# Patient Record
Sex: Male | Born: 1970
Health system: Southern US, Community
[De-identification: ages and names within clinical notes are randomized; demographics above are authoritative.]

## PROBLEM LIST (undated history)

## (undated) DIAGNOSIS — I639 Cerebral infarction, unspecified: Secondary | ICD-10-CM

## (undated) DIAGNOSIS — E785 Hyperlipidemia, unspecified: Secondary | ICD-10-CM

## (undated) DIAGNOSIS — K08109 Complete loss of teeth, unspecified cause, unspecified class: Secondary | ICD-10-CM

## (undated) HISTORY — DX: Cerebral infarction, unspecified: I63.9

## (undated) HISTORY — DX: Hyperlipidemia, unspecified: E78.5

## (undated) HISTORY — PX: NO PAST SURGERIES: SHX2092

---

## 2007-03-31 ENCOUNTER — Emergency Department: Payer: Self-pay | Admitting: Emergency Medicine

## 2008-08-18 ENCOUNTER — Emergency Department: Payer: Self-pay | Admitting: Emergency Medicine

## 2015-08-14 DIAGNOSIS — I639 Cerebral infarction, unspecified: Secondary | ICD-10-CM

## 2015-08-14 HISTORY — DX: Cerebral infarction, unspecified: I63.9

## 2015-08-17 ENCOUNTER — Emergency Department
Admission: EM | Admit: 2015-08-17 | Discharge: 2015-08-17 | Disposition: A | Discharge status: Home / Self Care | Payer: BLUE CROSS/BLUE SHIELD | Attending: Student | Admitting: Student

## 2015-08-17 ENCOUNTER — Encounter: Payer: Self-pay | Admitting: Emergency Medicine

## 2015-08-17 DIAGNOSIS — R197 Diarrhea, unspecified: Secondary | ICD-10-CM | POA: Diagnosis not present

## 2015-08-17 DIAGNOSIS — R1032 Left lower quadrant pain: Secondary | ICD-10-CM | POA: Diagnosis present

## 2015-08-17 DIAGNOSIS — R1110 Vomiting, unspecified: Secondary | ICD-10-CM | POA: Diagnosis not present

## 2015-08-17 LAB — URINALYSIS COMPLETE WITH MICROSCOPIC (ARMC ONLY)
BACTERIA UA: NONE SEEN
BILIRUBIN URINE: NEGATIVE
GLUCOSE, UA: NEGATIVE mg/dL
KETONES UR: NEGATIVE mg/dL
LEUKOCYTES UA: NEGATIVE
NITRITE: NEGATIVE
PROTEIN: NEGATIVE mg/dL
SPECIFIC GRAVITY, URINE: 1.016 (ref 1.005–1.030)
Squamous Epithelial / LPF: NONE SEEN
pH: 5 (ref 5.0–8.0)

## 2015-08-17 LAB — CBC
HEMATOCRIT: 42.8 % (ref 40.0–52.0)
Hemoglobin: 14.5 g/dL (ref 13.0–18.0)
MCH: 29 pg (ref 26.0–34.0)
MCHC: 33.9 g/dL (ref 32.0–36.0)
MCV: 85.5 fL (ref 80.0–100.0)
Platelets: 292 10*3/uL (ref 150–440)
RBC: 5.01 MIL/uL (ref 4.40–5.90)
RDW: 13.4 % (ref 11.5–14.5)
WBC: 7.5 10*3/uL (ref 3.8–10.6)

## 2015-08-17 LAB — COMPREHENSIVE METABOLIC PANEL
ALK PHOS: 63 U/L (ref 38–126)
ALT: 45 U/L (ref 17–63)
ANION GAP: 7 (ref 5–15)
AST: 29 U/L (ref 15–41)
Albumin: 4.9 g/dL (ref 3.5–5.0)
BILIRUBIN TOTAL: 0.8 mg/dL (ref 0.3–1.2)
BUN: 12 mg/dL (ref 6–20)
CALCIUM: 9.9 mg/dL (ref 8.9–10.3)
CO2: 28 mmol/L (ref 22–32)
Chloride: 106 mmol/L (ref 101–111)
Creatinine, Ser: 0.92 mg/dL (ref 0.61–1.24)
GFR calc Af Amer: >60 mL/min (ref >60)
GLUCOSE: 95 mg/dL (ref 65–99)
POTASSIUM: 4.3 mmol/L (ref 3.5–5.1)
Sodium: 141 mmol/L (ref 135–145)
TOTAL PROTEIN: 8.2 g/dL — AB (ref 6.5–8.1)

## 2015-08-17 LAB — LIPASE, BLOOD: Lipase: 23 U/L (ref 11–51)

## 2015-08-17 NOTE — ED Notes (Signed)
Pt presents with left lower quad pain for about two mths with n/v/d.

## 2015-08-17 NOTE — ED Provider Notes (Signed)
[facility] Provider Note  ____________________________________________  Time seen: Approximately 1:47 PM  I have reviewed the triage vital signs and the nursing notes.   HISTORY  Chief Complaint Abdominal Pain    HPI Rodney Huff is a 45 y.o. male no chronic medical problems who presents for evaluation of 2 months intermittent left lower quadrant pain, usually sudden onset, usually resolves after a few minutes, currently resolved, no modifying factors. Patient reports that last night he had severe pain in the left lower abdomen however it has resolved at this time. He reports that 3 weeks ago he had one day of recurrent vomiting and diarrhea however none since. No fevers. No chest pain or difficulty breathing. No pain or burning with urination, no hematuria, no history of kidney stones.   History reviewed. No pertinent past medical history.  There are no active problems to display for this patient.   History reviewed. No pertinent past surgical history.  No current outpatient prescriptions on file.  Allergies Review of patient's allergies indicates no known allergies.  No family history on file.  Social History Social History  Substance Use Topics  . Smoking status: Never Smoker   . Smokeless tobacco: None  . Alcohol Use: No    Review of Systems Constitutional: No fever/chills Eyes: No visual changes. ENT: No sore throat. Cardiovascular: Denies chest pain. Respiratory: Denies shortness of breath. Gastrointestinal: + abdominal pain.  No nausea, + vomiting.  + diarrhea.  No constipation. Genitourinary: Negative for dysuria. Musculoskeletal: Negative for back pain. Skin: Negative for rash. Neurological: Negative for headaches, focal weakness or numbness.  10-point ROS otherwise negative.  ____________________________________________   PHYSICAL EXAM:  VITAL SIGNS: ED Triage Vitals  Enc Vitals Group     BP 08/17/15  1049 119/56 mmHg     Pulse Rate 08/17/15 1049 104     Resp 08/17/15 1049 18     Temp 08/17/15 1049 98.2 F (36.8 C)     Temp Source 08/17/15 1049 Oral     SpO2 08/17/15 1049 98 %     Weight 08/17/15 1049 221 lb (100.245 kg)     Height 08/17/15 1049 6\' 2"  (1.88 m)     Head Cir --      Peak Flow --      Pain Score 08/17/15 1053 7     Pain Loc --      Pain Edu? --      Excl. in GC? --     Constitutional: Alert and oriented. Well appearing and in no acute distress. Eyes: Conjunctivae are normal. PERRL. EOMI. Head: Atraumatic. Nose: No congestion/rhinnorhea. Mouth/Throat: Mucous membranes are moist.  Oropharynx non-erythematous. Neck: No stridor. Cardiovascular: Normal rate, regular rhythm. Grossly normal heart sounds.  Good peripheral circulation. Respiratory: Normal respiratory effort.  No retractions. Lungs CTAB. Gastrointestinal: Soft and nontender. No distention. No CVA tenderness. Genitourinary: deferred Musculoskeletal: No lower extremity tenderness nor edema.  No joint effusions. Neurologic:  Normal speech and language. No gross focal neurologic deficits are appreciated. No gait instability. Skin:  Skin is warm, dry and intact. No rash noted. Psychiatric: Mood and affect are normal. Speech and behavior are normal.  ____________________________________________   LABS (all labs ordered are listed, but only abnormal results are displayed)  Labs Reviewed  COMPREHENSIVE METABOLIC PANEL - Abnormal; Notable for the following:    Total Protein 8.2 (*)    All other components within normal limits  URINALYSIS COMPLETEWITH MICROSCOPIC ([facility] ONLY) - Abnormal; Notable for the  following:    Color, Urine YELLOW (*)    APPearance CLEAR (*)    Hgb urine dipstick 2+ (*)    All other components within normal limits  LIPASE, BLOOD  CBC    ____________________________________________  EKG  none ____________________________________________  RADIOLOGY  none ____________________________________________   PROCEDURES  Procedure(s) performed: None  Critical Care performed: No  ____________________________________________   INITIAL IMPRESSION / ASSESSMENT AND PLAN / ED COURSE  Pertinent labs & imaging results that were available during my care of the patient were reviewed by me and considered in my medical decision making (see chart for details).  [name] is a 45 y.o. male no chronic medical problems who presents for evaluation of 2 months intermittent left lower quadrant pain, usually sudden onset, usually resolves after a few minutes, currently resolved, no modifying factors. On exam, he is very well-appearing and in no acute distress. Mildly tachycardic on triage vitals however this has resolved at the time of my assessment. The patient currently has no pain complaints and he has a benign abdominal examination. Labs are reviewed. Normal CBC, normal CMP, normal lipase, urinalysis with minimal red and white blood cells, no bacteria, not consistent with infection. Given the patient's well appearance, and normal vital signs and labs, ongoing symptoms for nearly 2 months, I doubt any acute life-threatening intra-abdominal pathology in this patient however given prolonged symptoms, I have offered a CT scan of the abdomen and pelvis today to further evaluate the source of his pain today. The patient has declined CT scan due to time constraints. He prefers instead to follow up with his primary care doctor. I discussed meticulous  return precautions with him including immediate return precautions and he is comfortable with the discharge plan. DC home. ____________________________________________   FINAL CLINICAL IMPRESSION(S) / ED DIAGNOSES  Final diagnoses:  Left lower quadrant pain      Gayla DossEryka A Cullen Lahaie, MD 08/17/15  2102

## 2017-01-09 ENCOUNTER — Emergency Department: Payer: BLUE CROSS/BLUE SHIELD

## 2017-01-09 ENCOUNTER — Observation Stay
Admission: EM | Admit: 2017-01-09 | Discharge: 2017-01-10 | Disposition: A | Discharge status: Home / Self Care | Payer: BLUE CROSS/BLUE SHIELD | Attending: Internal Medicine | Admitting: Internal Medicine

## 2017-01-09 DIAGNOSIS — G459 Transient cerebral ischemic attack, unspecified: Principal | ICD-10-CM | POA: Diagnosis present

## 2017-01-09 DIAGNOSIS — G453 Amaurosis fugax: Secondary | ICD-10-CM

## 2017-01-09 DIAGNOSIS — E785 Hyperlipidemia, unspecified: Secondary | ICD-10-CM | POA: Insufficient documentation

## 2017-01-09 DIAGNOSIS — Z823 Family history of stroke: Secondary | ICD-10-CM | POA: Diagnosis not present

## 2017-01-09 DIAGNOSIS — H5462 Unqualified visual loss, left eye, normal vision right eye: Secondary | ICD-10-CM | POA: Diagnosis present

## 2017-01-09 HISTORY — DX: Transient cerebral ischemic attack, unspecified: G45.9

## 2017-01-09 HISTORY — DX: Complete loss of teeth, unspecified cause, unspecified class: K08.109

## 2017-01-09 LAB — TROPONIN I: Troponin I: <0.03 ng/mL (ref <0.03)

## 2017-01-09 LAB — COMPREHENSIVE METABOLIC PANEL
ALT: 43 U/L (ref 17–63)
AST: 35 U/L (ref 15–41)
Albumin: 4.6 g/dL (ref 3.5–5.0)
Alkaline Phosphatase: 68 U/L (ref 38–126)
Anion gap: 7 (ref 5–15)
BUN: 17 mg/dL (ref 6–20)
CHLORIDE: 106 mmol/L (ref 101–111)
CO2: 26 mmol/L (ref 22–32)
CREATININE: 0.97 mg/dL (ref 0.61–1.24)
Calcium: 9.2 mg/dL (ref 8.9–10.3)
GFR calc Af Amer: >60 mL/min (ref >60)
Glucose, Bld: 100 mg/dL — ABNORMAL HIGH (ref 65–99)
Potassium: 3.6 mmol/L (ref 3.5–5.1)
Sodium: 139 mmol/L (ref 135–145)
Total Bilirubin: 0.9 mg/dL (ref 0.3–1.2)
Total Protein: 7.9 g/dL (ref 6.5–8.1)

## 2017-01-09 LAB — CBC WITH DIFFERENTIAL/PLATELET
Basophils Absolute: 0.1 10*3/uL (ref 0–0.1)
Basophils Relative: 1 %
EOS ABS: 0.4 10*3/uL (ref 0–0.7)
EOS PCT: 4 %
HCT: 41.6 % (ref 40.0–52.0)
Hemoglobin: 14.5 g/dL (ref 13.0–18.0)
LYMPHS ABS: 2.8 10*3/uL (ref 1.0–3.6)
Lymphocytes Relative: 33 %
MCH: 30.1 pg (ref 26.0–34.0)
MCHC: 35 g/dL (ref 32.0–36.0)
MCV: 86.1 fL (ref 80.0–100.0)
MONO ABS: 0.8 10*3/uL (ref 0.2–1.0)
MONOS PCT: 9 %
Neutro Abs: 4.7 10*3/uL (ref 1.4–6.5)
Neutrophils Relative %: 53 %
PLATELETS: 318 10*3/uL (ref 150–440)
RBC: 4.83 MIL/uL (ref 4.40–5.90)
RDW: 13.2 % (ref 11.5–14.5)
WBC: 8.7 10*3/uL (ref 3.8–10.6)

## 2017-01-09 LAB — PROTIME-INR
INR: 0.98
Prothrombin Time: 13 seconds (ref 11.4–15.2)

## 2017-01-09 LAB — ETHANOL: Alcohol, Ethyl (B): <5 mg/dL (ref <5)

## 2017-01-09 MED ORDER — ACETAMINOPHEN 650 MG RE SUPP: 650.0000 mg | RECTAL | Status: DC | PRN

## 2017-01-09 MED ORDER — ENOXAPARIN SODIUM 40 MG/0.4ML ~~LOC~~ SOLN: 40.0000 mg | SUBCUTANEOUS | Status: DC

## 2017-01-09 MED ORDER — ASPIRIN 81 MG PO CHEW
324.0000 mg | CHEWABLE_TABLET | Freq: Once | ORAL | Status: AC
  Administered 2017-01-09: 324 mg via ORAL
  Filled 2017-01-09: qty 4

## 2017-01-09 MED ORDER — STROKE: EARLY STAGES OF RECOVERY BOOK
Freq: Once | Status: AC
  Administered 2017-01-09

## 2017-01-09 MED ORDER — ACETAMINOPHEN 325 MG PO TABS
650.0000 mg | ORAL_TABLET | ORAL | Status: DC | PRN
  Administered 2017-01-09: 650 mg via ORAL

## 2017-01-09 MED ORDER — ACETAMINOPHEN 325 MG PO TABS
ORAL_TABLET | ORAL | Status: AC
  Filled 2017-01-09: qty 2

## 2017-01-09 MED ORDER — ACETAMINOPHEN 160 MG/5ML PO SOLN
650.0000 mg | ORAL | Status: DC | PRN
  Filled 2017-01-09: qty 20.3

## 2017-01-09 NOTE — ED Provider Notes (Signed)
[facility] Emergency Department Provider Note  ____________________________________________   First MD Initiated Contact with Patient 01/09/17 1954     (approximate)  I have reviewed the triage vital signs and the nursing notes.   HISTORY  Chief Complaint Loss of Vision    HPI Rodney Huff is a 46 y.o. male who reports no chronic medical history and presents by private vehicle for evaluation of acute onset left sided arm weakness, numbness in the left side of his body and face, and left sided vision loss.  He reports that the symptoms started at approximately 7:20 PM.  He was seated at a table and he started to feel strange all over and flushed with a pounding headache.  He then noticed that the vision from his left eye is rapidly becoming very blurry only and then he lost it completely.  He knew that there is someone sitting next to him and he tried to reach out to the person but was unable to move his left arm.  He was also having difficulty expressing himself.  After a few minutes he started to regain his vision and was able to move his arm again and within 20 minutes the symptoms had completely resolved.  He is asymptomatic at this time.  He did not lose consciousness and reportedly did not have any seizure-like activity.  He has not had any similar symptoms in the past.  He currently has a mild headache but otherwise has no symptoms and reports no residual weakness.  He describes the symptoms as acute in onset and severe and occurred approximately 40 minutes prior to arrival.  He has no personal history of any medical issues but he does not have a regular doctor and does not go for regular checkups.  He reports that he is usually active and healthy.  He has no known family history of stroke although it is possible his father had one while he was suffering from cancer.  The patient does not smoke tobacco and does not drink alcohol.  He denies fever/chills, chest  pain, shortness of breath, nausea, vomiting, abdominal pain, dysuria.   History reviewed. No pertinent past medical history.  There are no active problems to display for this patient.   History reviewed. No pertinent surgical history.  Prior to Admission medications   Not on File    Allergies Patient has no known allergies.  No family history on file.  Social History Social History  Substance Use Topics  . Smoking status: Never Smoker  . Smokeless tobacco: Not on file  . Alcohol use No    Review of Systems Constitutional: No fever/chills Eyes: Acute onset transient visual loss from the left eye ENT: No sore throat. Cardiovascular: Denies chest pain. Respiratory: Denies shortness of breath. Gastrointestinal: No abdominal pain.  No nausea, no vomiting.  No diarrhea.  No constipation. Genitourinary: Negative for dysuria. Musculoskeletal: Negative for neck pain.  Negative for back pain. Integumentary: Negative for rash. Neurological: Acute onset headache, flushing, left facial numbness, left sided visual loss, and left arm weakness, completely resolved after about 20 minutes   ____________________________________________   PHYSICAL EXAM:  VITAL SIGNS: ED Triage Vitals  Enc Vitals Group     BP 01/09/17 1951 (!) 142/87     Pulse Rate 01/09/17 1951 81     Resp 01/09/17 1951 16     Temp 01/09/17 1951 98.1 F (36.7 C)     Temp Source 01/09/17 1951 Oral     SpO2  01/09/17 1951 100 %     Weight 01/09/17 1951 96.2 kg (212 lb)     Height 01/09/17 1951 1.88 m (6\' 2" )     Head Circumference --      Peak Flow --      Pain Score 01/09/17 1949 0     Pain Loc --      Pain Edu? --      Excl. in GC? --     Constitutional: Alert and oriented. Well appearing and in no acute distress. Eyes: Conjunctivae are normal. PERRL. EOMI.  No nystagmus. Head: Atraumatic. Nose: No congestion/rhinnorhea. Mouth/Throat: Mucous membranes are moist. Neck: No stridor.  No meningeal signs.    Cardiovascular: Normal rate, regular rhythm. Good peripheral circulation. Grossly normal heart sounds. Respiratory: Normal respiratory effort.  No retractions. Lungs CTAB. Gastrointestinal: Soft and nontender. No distention.  Musculoskeletal: No lower extremity tenderness nor edema. No gross deformities of extremities. Neurologic:  Normal speech and language. No gross focal neurologic deficits are appreciated; patient has normal finger to nose testing with no dysmetria, normal grip strength bilaterally, normal upper and lower extremity age or muscle grip strength, no gross cranial nerve deficits, and an NIH stroke scale as listed below. Skin:  Skin is warm, dry and intact. No rash noted. Psychiatric: Mood and affect are normal. Speech and behavior are normal.  ____________________________________________   LABS (all labs ordered are listed, but only abnormal results are displayed)  Labs Reviewed  COMPREHENSIVE METABOLIC PANEL - Abnormal; Notable for the following:       Result Value   Glucose, Bld 100 (*)    All other components within normal limits  CBC WITH DIFFERENTIAL/PLATELET  ETHANOL  TROPONIN I  PROTIME-INR  URINE DRUG SCREEN, QUALITATIVE ([facility] ONLY)  URINALYSIS, ROUTINE W REFLEX MICROSCOPIC   ____________________________________________  EKG  ED ECG REPORT I, Carlen Rebuck, the attending physician, personally viewed and interpreted this ECG.  Date: 01/09/2017 EKG Time: 20:30 Rate: 90 Rhythm: normal sinus rhythm QRS Axis: normal Intervals: normal ST/T Wave abnormalities: normal Conduction Disturbances: none Narrative Interpretation: unremarkable  ____________________________________________  RADIOLOGY   Ct Head Wo Contrast  Result Date: 01/09/2017 CLINICAL DATA:  Left side facial numbness.  Vision changes. EXAM: CT HEAD WITHOUT CONTRAST TECHNIQUE: Contiguous axial images were obtained from the base of the skull through the vertex without intravenous  contrast. COMPARISON:  None. FINDINGS: Brain: No evidence of acute infarction, hemorrhage, hydrocephalus, extra-axial collection or mass lesion/mass effect. Vascular: No hyperdense vessel or unexpected calcification. Skull: Normal. Negative for fracture or focal lesion. Sinuses/Orbits: No acute finding. Other: None. IMPRESSION: 1. No acute intracranial abnormality.  Normal brain Electronically Signed   By: Signa Kell M.D.   On: 01/09/2017 20:04    ____________________________________________   PROCEDURES  Critical Care performed: No   Procedure(s) performed:   Procedures   ____________________________________________   INITIAL IMPRESSION / ASSESSMENT AND PLAN / ED COURSE  Pertinent labs & imaging results that were available during my care of the patient were reviewed by me and considered in my medical decision making (see chart for details).  NIH Stroke Scale  Interval: Baseline Time: 8:00 PM Person Administering Scale: Analeise Mccleery  Administer stroke scale items in the order listed. Record performance in each category after each subscale exam. Do not go back and change scores. Follow directions provided for each exam technique. Scores should reflect what the patient does, not what the clinician thinks the patient can do. The clinician should record answers while administering the exam  and work quickly. Except where indicated, the patient should not be coached (i.e., repeated requests to patient to make a special effort).   1a  Level of consciousness: 0=alert; keenly responsive  1b. LOC questions:  0=Performs both tasks correctly  1c. LOC commands: 0=Performs both tasks correctly  2.  Best Gaze: 0=normal  3.  Visual: 0=No visual loss  4. Facial Palsy: 0=Normal symmetric movement  5a.  Motor left arm: 0=No drift, limb holds 90 (or 45) degrees for full 10 seconds  5b.  Motor right arm: 0=No drift, limb holds 90 (or 45) degrees for full 10 seconds  6a. motor left leg: 0=No  drift, limb holds 90 (or 45) degrees for full 10 seconds  6b  Motor right leg:  0=No drift, limb holds 90 (or 45) degrees for full 10 seconds  7. Limb Ataxia: 0=Absent  8.  Sensory: 0=Normal; no sensory loss  9. Best Language:  0=No aphasia, normal  10. Dysarthria: 0=Normal  11. Extinction and Inattention: 0=No abnormality  12. Distal motor function: 0=Normal   Total:   0      Clinical Course as of Jan 10 2204  Wed Jan 09, 2017  2005 The patient presents within 40 minutes of acute onset of strokelike symptoms.  However, due to his current NIH stroke scale of 0 with rapid resolution of the symptoms, he is not a TPA candidate.  I do not feel he would benefit from teleneuro consult at this time.  His story is very concerning and involves more than just transient visual loss; he was unable to move his left arm and had acute onset of headache, flushing, and facial and body numbness on the left side, consistent with CVA/TIA.  I believe she would benefit from inpatient stroke workup and further evaluation.  Standard labs are pending at this time.  I discussed the case with the patient and he understands and agrees with plan.  [CF]  2018 No evidence of acute CVA/hemorrhage on CT per radiology report. CT Head Wo Contrast [CF]    Clinical Course User Index [CF] Loleta Rose, MD    ____________________________________________  FINAL CLINICAL IMPRESSION(S) / ED DIAGNOSES  Final diagnoses:  Transient cerebral ischemia, unspecified type  Amaurosis fugax of left eye     MEDICATIONS GIVEN DURING THIS VISIT:  Medications  aspirin chewable tablet 324 mg (not administered)     NEW OUTPATIENT MEDICATIONS STARTED DURING THIS VISIT:  New Prescriptions   No medications on file    Modified Medications   No medications on file    Discontinued Medications   No medications on file     Note:  This document was prepared using Dragon voice recognition software and may include unintentional  dictation errors.    Loleta Rose, MD 01/09/17 2205

## 2017-01-09 NOTE — ED Notes (Signed)
Pt reporting headache continues at this time. MD paged in regards to PRN pain medication.

## 2017-01-09 NOTE — H&P (Signed)
[facility] [place] at [facility]   PATIENT NAME: Rodney Huff    MR#:  [id number]  DATE OF BIRTH:  08-13-1971  DATE OF ADMISSION:  01/09/2017  PRIMARY CARE PHYSICIAN: Patient, No Pcp Per   REQUESTING/REFERRING PHYSICIAN: York Cerise, MD  CHIEF COMPLAINT:   Chief Complaint  Patient presents with  . Loss of Vision    HISTORY OF PRESENT ILLNESS:  Rodney Huff  is a 46 y.o. male who presents with TIA. Patient states that he was at a [employer] when he experienced sudden onset left-sided blurring of vision, facial numbness, left arm weakness and numbness, and some mild confusion. He states that he felt like something was wrong, and he tried to reach to the person sitting on his left but could not get his arm to work. He had difficulty seeing that person as well. His wife was seated to the right of him, and he was able to get her attention and uses right side without difficulty. His wife states that he did not have slurred speech, but that sometimes when she would ask him a question he simply would not respond. Patient states that the symptoms lasted somewhere about 20-25 minutes before he had returned to what he feels like is his baseline neurological status. He has had a significant headache afterwards. He has family history of strokes, but typically occurring in his family members and a much older age than he currently is. He has no other significant medical history. Hospitalists were called for admission and further evaluation.  PAST MEDICAL HISTORY:   Past Medical History:  Diagnosis Date  . Missing teeth, acquired     PAST SURGICAL HISTORY:   Past Surgical History:  Procedure Laterality Date  . NO PAST SURGERIES      SOCIAL HISTORY:   Social History  Substance Use Topics  . Smoking status: Never Smoker  . Smokeless tobacco: Not on file  . Alcohol use No    FAMILY HISTORY:   Family History  Problem Relation Age of Onset  . Lung cancer Father    . Colon cancer Father     DRUG ALLERGIES:  No Known Allergies  MEDICATIONS AT HOME:   Prior to Admission medications   Not on File    REVIEW OF SYSTEMS:  Review of Systems  Constitutional: Negative for chills, fever, malaise/fatigue and weight loss.  HENT: Negative for ear pain, hearing loss and tinnitus.   Eyes: Negative for blurred vision, double vision, pain and redness.  Respiratory: Negative for cough, hemoptysis and shortness of breath.   Cardiovascular: Negative for chest pain, palpitations, orthopnea and leg swelling.  Gastrointestinal: Negative for abdominal pain, constipation, diarrhea, nausea and vomiting.  Genitourinary: Negative for dysuria, frequency and hematuria.  Musculoskeletal: Negative for back pain, joint pain and neck pain.  Skin:       No acne, rash, or lesions  Neurological: Positive for sensory change, speech change, focal weakness and headaches. Negative for dizziness, tremors and weakness.  Endo/Heme/Allergies: Negative for polydipsia. Does not bruise/bleed easily.  Psychiatric/Behavioral: Negative for depression. The patient is not nervous/anxious and does not have insomnia.      VITAL SIGNS:   Vitals:   01/09/17 1951 01/09/17 2001  BP: (!) 142/87 (!) 102/91  Pulse: 81 81  Resp: 16 13  Temp: 98.1 F (36.7 C)   TempSrc: Oral   SpO2: 100% 96%  Weight: 96.2 kg (212 lb)   Height: 6\' 2"  (1.88 m)    Wt Readings  from Last 3 Encounters:  01/09/17 96.2 kg (212 lb)  08/17/15 100.2 kg (221 lb)    PHYSICAL EXAMINATION:  Physical Exam  Vitals reviewed. Constitutional: He appears well-developed and well-nourished. No distress.  HENT:  Head: Normocephalic and atraumatic.  Mouth/Throat: Oropharynx is clear and moist.  Eyes: Conjunctivae and EOM are normal. Pupils are equal, round, and reactive to light. No scleral icterus.  Neck: Normal range of motion. Neck supple. No JVD present. No thyromegaly present.  Cardiovascular: Normal rate, regular  rhythm and intact distal pulses.  Exam reveals no gallop and no friction rub.   No murmur heard. Respiratory: Effort normal and breath sounds normal. No respiratory distress. He has no wheezes. He has no rales.  GI: Soft. Bowel sounds are normal. He exhibits no distension. There is no tenderness.  Musculoskeletal: Normal range of motion. He exhibits no edema.  No arthritis, no gout  Lymphadenopathy:    He has no cervical adenopathy.  Neurological:  Neurologic: Cranial nerves II-XII intact, Sensation intact to light touch/pinprick, 5/5 strength in all extremities, no dysarthria, no aphasia, no dysphagia, memory intact, rapid alternating movements normal on right but showed slightly worse coordination on left with double tapping on each alternating movement, no pronator drift, gait testing not performed   Skin: Skin is warm and dry. No rash noted. No erythema.  Psychiatric: He has a normal mood and affect. His behavior is normal. Judgment and thought content normal.    LABORATORY PANEL:   CBC  Recent Labs Lab 01/09/17 2006  WBC 8.7  HGB 14.5  HCT 41.6  PLT 318   ------------------------------------------------------------------------------------------------------------------  Chemistries   Recent Labs Lab 01/09/17 2006  NA 139  K 3.6  CL 106  CO2 26  GLUCOSE 100*  BUN 17  CREATININE 0.97  CALCIUM 9.2  AST 35  ALT 43  ALKPHOS 68  BILITOT 0.9   ------------------------------------------------------------------------------------------------------------------  Cardiac Enzymes  Recent Labs Lab 01/09/17 2006  TROPONINI <0.03   ------------------------------------------------------------------------------------------------------------------  RADIOLOGY:  Ct Head Wo Contrast  Result Date: 01/09/2017 CLINICAL DATA:  Left side facial numbness.  Vision changes. EXAM: CT HEAD WITHOUT CONTRAST TECHNIQUE: Contiguous axial images were obtained from the base of the skull  through the vertex without intravenous contrast. COMPARISON:  None. FINDINGS: Brain: No evidence of acute infarction, hemorrhage, hydrocephalus, extra-axial collection or mass lesion/mass effect. Vascular: No hyperdense vessel or unexpected calcification. Skull: Normal. Negative for fracture or focal lesion. Sinuses/Orbits: No acute finding. Other: None. IMPRESSION: 1. No acute intracranial abnormality.  Normal brain Electronically Signed   By: Signa Kellaylor  Stroud M.D.   On: 01/09/2017 20:04    EKG:   Orders placed or performed during the hospital encounter of 01/09/17  . ED EKG  . ED EKG  . EKG 12-Lead  . EKG 12-Lead    IMPRESSION AND PLAN:  Principal Problem:   TIA (transient ischemic attack) - ABCD 2 score of 2-3, making him low risk. However, his story is significantly typical for a neurologic event, he does have some very mild left-sided lingering coordination symptom with rapid alternating movements, see physical exam above. He also has headache after the event. We will admit him for observation, with appropriate imaging lab work and neurology consult for TIA/stroke workup.  All the records are reviewed and case discussed with ED provider. Management plans discussed with the patient and/or family.  DVT PROPHYLAXIS: SubQ lovenox  GI PROPHYLAXIS: None  ADMISSION STATUS: Observation  CODE STATUS: Full Code Status History    This  patient does not have a recorded code status. Please follow your organizational policy for patients in this situation.      TOTAL TIME TAKING CARE OF THIS PATIENT: 40 minutes.   Pelham Hennick FIELDING 01/09/2017, 9:00 PM  [facility]  Office  [phone]  CC: Primary care physician; Patient, No Pcp Per  Note:  This document was prepared using Dragon voice recognition software and may include unintentional dictation errors.

## 2017-01-09 NOTE — ED Notes (Signed)
Pt reports 30 minutes ago he was having visual changes with blurred visio in left eye but denies complete loss of vision. Pt reports having numbness in left side of face and left arm. Numbness is gone but pt reports having a severe headache at this time.

## 2017-01-09 NOTE — ED Triage Notes (Signed)
Pt had episode of blurry vision and lost of peripheral vision for approx 20 minutes. No slurred speech or weakness at that time. Sx have resolved now.  Episode started at approx 1920.

## 2017-01-09 NOTE — ED Notes (Signed)
Pt taken to CT.

## 2017-01-10 ENCOUNTER — Observation Stay: Payer: BLUE CROSS/BLUE SHIELD

## 2017-01-10 ENCOUNTER — Observation Stay (HOSPITAL_BASED_OUTPATIENT_CLINIC_OR_DEPARTMENT_OTHER)
Admit: 2017-01-10 | Discharge: 2017-01-10 | Disposition: A | Discharge status: Home / Self Care | Payer: BLUE CROSS/BLUE SHIELD | Attending: Internal Medicine | Admitting: Internal Medicine

## 2017-01-10 DIAGNOSIS — G459 Transient cerebral ischemic attack, unspecified: Secondary | ICD-10-CM

## 2017-01-10 DIAGNOSIS — G43109 Migraine with aura, not intractable, without status migrainosus: Secondary | ICD-10-CM | POA: Diagnosis not present

## 2017-01-10 LAB — LIPID PANEL
CHOLESTEROL: 140 mg/dL (ref 0–200)
HDL: 28 mg/dL — ABNORMAL LOW (ref >40)
LDL CALC: 101 mg/dL — AB (ref 0–99)
TRIGLYCERIDES: 57 mg/dL (ref <150)
Total CHOL/HDL Ratio: 5 RATIO
VLDL: 11 mg/dL (ref 0–40)

## 2017-01-10 LAB — URINE DRUG SCREEN, QUALITATIVE (ARMC ONLY)
Amphetamines, Ur Screen: NOT DETECTED
Barbiturates, Ur Screen: NOT DETECTED
Benzodiazepine, Ur Scrn: NOT DETECTED
Cannabinoid 50 Ng, Ur ~~LOC~~: NOT DETECTED
Cocaine Metabolite,Ur ~~LOC~~: NOT DETECTED
MDMA (ECSTASY) UR SCREEN: NOT DETECTED
Methadone Scn, Ur: NOT DETECTED
Opiate, Ur Screen: NOT DETECTED
Phencyclidine (PCP) Ur S: NOT DETECTED
TRICYCLIC, UR SCREEN: NOT DETECTED

## 2017-01-10 LAB — URINALYSIS, ROUTINE W REFLEX MICROSCOPIC
Bacteria, UA: NONE SEEN
Bilirubin Urine: NEGATIVE
GLUCOSE, UA: NEGATIVE mg/dL
HGB URINE DIPSTICK: NEGATIVE
Ketones, ur: NEGATIVE mg/dL
Leukocytes, UA: NEGATIVE
NITRITE: NEGATIVE
Protein, ur: NEGATIVE mg/dL
SPECIFIC GRAVITY, URINE: 1.029 (ref 1.005–1.030)
Squamous Epithelial / LPF: NONE SEEN
pH: 6 (ref 5.0–8.0)

## 2017-01-10 LAB — ECHOCARDIOGRAM COMPLETE
HEIGHTINCHES: 74 in
Weight: 3392 oz

## 2017-01-10 MED ORDER — GADOBENATE DIMEGLUMINE 529 MG/ML IV SOLN
20.0000 mL | Freq: Once | INTRAVENOUS | Status: AC | PRN
  Administered 2017-01-10: 13:00:00 20 mL via INTRAVENOUS

## 2017-01-10 MED ORDER — ATORVASTATIN CALCIUM 40 MG PO TABS
40.0000 mg | ORAL_TABLET | Freq: Every day | ORAL | 0 refills | Status: DC
Start: 2017-01-10 — End: 2017-01-21

## 2017-01-10 MED ORDER — LORAZEPAM 2 MG/ML IJ SOLN
0.5000 mg | INTRAMUSCULAR | Status: AC
  Administered 2017-01-10: 12:00:00 0.5 mg via INTRAVENOUS
  Filled 2017-01-10: qty 1

## 2017-01-10 MED ORDER — ASPIRIN EC 81 MG PO TBEC
81.0000 mg | DELAYED_RELEASE_TABLET | Freq: Every day | ORAL | Status: DC
  Administered 2017-01-10: 81 mg via ORAL
  Filled 2017-01-10: qty 1

## 2017-01-10 MED ORDER — ASPIRIN 81 MG PO TBEC: 81.0000 mg | DELAYED_RELEASE_TABLET | Freq: Every day | ORAL | 0 refills | Status: AC

## 2017-01-10 MED ORDER — LORAZEPAM 2 MG/ML IJ SOLN
0.5000 mg | Freq: Once | INTRAMUSCULAR | Status: AC
  Administered 2017-01-10: 0.5 mg via INTRAVENOUS
  Filled 2017-01-10: qty 1

## 2017-01-10 MED ORDER — ATORVASTATIN CALCIUM 20 MG PO TABS: 40.0000 mg | ORAL_TABLET | Freq: Every day | ORAL | Status: DC

## 2017-01-10 NOTE — Evaluation (Signed)
Physical Therapy Evaluation Patient Details Name: Rodney Huff MRN: [id number] DOB: 08/08/1971 Today's Date: 01/10/2017   History of Present Illness  46 y/o male here after brief episode of L sided vision issues and inability to use L UE.  He is feeling back to baseline at time of PT exam.    Clinical Impression  Pt did very well with PT exam showing no strength, balance, coordination or other concerns.  He was confident with prolonged ambulation and stair negotiation and generally did very well.  He had no functional limitations and does not require further PT intervention.     Follow Up Recommendations No PT follow up    Equipment Recommendations       Recommendations for Other Services       Precautions / Restrictions Restrictions Weight Bearing Restrictions: No      Mobility  Bed Mobility Overal bed mobility: Independent                Transfers Overall transfer level: Independent                  Ambulation/Gait Ambulation/Gait assistance: Independent Ambulation Distance (Feet): 250 Feet Assistive device: None       General Gait Details: Pt is able to ambulate with good confidence and generally showed appropriate speed and mobility.  He had no hesitancy, no safety issues and generally did very well.  Stairs Stairs: Yes Stairs assistance: Independent Stair Management: No rails Number of Stairs: 6 General stair comments: Pt negotiates up/down steps w/o issue  Wheelchair Mobility    Modified Rankin (Stroke Patients Only)       Balance Overall balance assessment: Independent                                           Pertinent Vitals/Pain Pain Assessment: No/denies pain    Home Living Family/patient expects to be discharged to:: Private residence Living Arrangements: Spouse/significant other;Children Available Help at Discharge: Family Type of Home: House Home Access: Stairs to enter Entrance Stairs-Rails: Can reach  both Entrance Stairs-Number of Steps: 3          Prior Function Level of Independence: Independent         Comments: Pt works, able to stay active     Hand Dominance        Extremity/Trunk Assessment   Upper Extremity Assessment Upper Extremity Assessment: Overall WFL for tasks assessed    Lower Extremity Assessment Lower Extremity Assessment: Overall WFL for tasks assessed       Communication   Communication: No difficulties  Cognition Arousal/Alertness: Awake/alert Behavior During Therapy: WFL for tasks assessed/performed Overall Cognitive Status: Within Functional Limits for tasks assessed                                        General Comments      Exercises     Assessment/Plan    PT Assessment Patent does not need any further PT services  PT Problem List         PT Treatment Interventions      PT Goals (Current goals can be found in the Care Plan section)  Acute Rehab PT Goals PT Goal Formulation: All assessment and education complete, DC therapy    Frequency  Barriers to discharge        Co-evaluation               AM-PAC PT "6 Clicks" Daily Activity  Outcome Measure Difficulty turning over in bed (including adjusting bedclothes, sheets and blankets)?: None Difficulty moving from lying on back to sitting on the side of the bed? : None Difficulty sitting down on and standing up from a chair with arms (e.g., wheelchair, bedside commode, etc,.)?: None Help needed moving to and from a bed to chair (including a wheelchair)?: None Help needed walking in hospital room?: None Help needed climbing 3-5 steps with a railing? : None 6 Click Score: 24    End of Session Equipment Utilized During Treatment: Gait belt Activity Tolerance: Patient tolerated treatment well Patient left: in chair;with call bell/phone within reach   PT Visit Diagnosis: Muscle weakness (generalized) (M62.81)    Time: 1610-9604 PT Time  Calculation (min) (ACUTE ONLY): 14 min   Charges:   PT Evaluation $PT Eval Low Complexity: 1 Procedure     PT G Codes:   PT G-Codes **NOT FOR INPATIENT CLASS** Functional Assessment Tool Used: AM-PAC 6 Clicks Basic Mobility Functional Limitation: Mobility: Walking and moving around Mobility: Walking and Moving Around Current Status (V4098): 0 percent impaired, limited or restricted Mobility: Walking and Moving Around Goal Status (J1914): 0 percent impaired, limited or restricted Mobility: Walking and Moving Around Discharge Status (N8295): 0 percent impaired, limited or restricted    Malachi Pro, DPT 01/10/2017, 10:41 AM

## 2017-01-10 NOTE — Progress Notes (Signed)
OT Cancellation Note  Patient Details Name: Rodney Huff MRN: [id number] DOB: 10/29/1970   Cancelled Treatment:    Reason Eval/Treat Not Completed: Other (comment). On 2nd attempt, pt recently back from testing, just received lunch and agreeable to OT coming back after he was able to eat. Will re-attempt as schedule allows.  Richrd PrimeJamie Stiller, MPH, MS, OTR/L ascom [phone] 01/10/17, 2:10 PM

## 2017-01-10 NOTE — Plan of Care (Signed)
Problem: Education: Goal: Knowledge of [facility] Education information/materials will improve Outcome: Progressing VS WDL, free of falls during shift.  NIH 0, neuro checks stable.  Reported continued HA 5/10 upon arriving to unit, too early for PRN Tylenol.  Pt able to sleep between VS/neuro checks.  Care clustered to promote rest.  MRI/MRA attempted, pt unable to tolerate scanner.  Wife at bedside, call bell within reach.  WCTM.

## 2017-01-10 NOTE — Discharge Summary (Signed)
[facility] - [place] [facility]   PATIENT NAME: Rodney Huff    MR#:  [id number]  DATE OF BIRTH:  09/01/1970  DATE OF ADMISSION:  01/09/2017 ADMITTING PHYSICIAN: Oralia Manisavid Willis, MD  DATE OF DISCHARGE: 01/10/2017  PRIMARY CARE PHYSICIAN: Will be Dr Gillermo MurdochAhmed Tejan-sie   ADMISSION DIAGNOSIS:  Amaurosis fugax of left eye [G45.3] Transient cerebral ischemia, unspecified type [G45.9]  DISCHARGE DIAGNOSIS:  Principal Problem:   TIA (transient ischemic attack)   SECONDARY DIAGNOSIS:   Past Medical History:  Diagnosis Date  . Missing teeth, acquired     HOSPITAL COURSE:   1. Transient ischemic attack. Patient had a headache last night with blurred vision of his left eye and unable to move his left arm then it was numb. He was a little unsteady with his gait but unsure if it was because of his vision or not. MRI of the brain negative. MRA of the neck negative. Echocardiogram currently being done. If that is negative he can go home on aspirin and Lipitor.  Case discussed with neurology that this may also be a migraine variant. Follow-up with neurology as outpatient. Currently she did not recommend medications for migraine at this time. 2. Hyperlipidemia unspecified LDL 101 goal less than 70  DISCHARGE CONDITIONS:   Satisfactory  CONSULTS OBTAINED:  Treatment Team:  Thana Farreynolds, Leslie, MD  DRUG ALLERGIES:  No Known Allergies  DISCHARGE MEDICATIONS:   Current Discharge Medication List    START taking these medications   Details  aspirin EC 81 MG EC tablet Take 1 tablet (81 mg total) by mouth daily. Qty: 30 tablet, Refills: 0    atorvastatin (LIPITOR) 40 MG tablet Take 1 tablet (40 mg total) by mouth daily at 6 PM. Qty: 30 tablet, Refills: 0         DISCHARGE INSTRUCTIONS:    Follow-up medical doctor one week Follow-up with neurology 2 weeks   If you experience worsening of your admission symptoms, develop shortness of breath, life threatening  emergency, suicidal or homicidal thoughts you must seek medical attention immediately by calling 911 or calling your MD immediately  if symptoms less severe.  You Must read complete instructions/literature along with all the possible adverse reactions/side effects for all the Medicines you take and that have been prescribed to you. Take any new Medicines after you have completely understood and accept all the possible adverse reactions/side effects.   Please note  You were cared for by a hospitalist during your hospital stay. If you have any questions about your discharge medications or the care you received while you were in the hospital after you are discharged, you can call the unit and asked to speak with the hospitalist on call if the hospitalist that took care of you is not available. Once you are discharged, your primary care physician will handle any further medical issues. Please note that NO REFILLS for any discharge medications will be authorized once you are discharged, as it is imperative that you return to your primary care physician (or establish a relationship with a primary care physician if you do not have one) for your aftercare needs so that they can reassess your need for medications and monitor your lab values.    Today   CHIEF COMPLAINT:   Chief Complaint  Patient presents with  . Loss of Vision    HISTORY OF PRESENT ILLNESS:  Rodney Benderedro Chestnut  is a 46 y.o. male presented with loss of vision in the left eye and left  arm weakness and unsteady gait.   VITAL SIGNS:  Blood pressure 126/73, pulse 72, temperature 98 F (36.7 C), temperature source Oral, resp. rate 20, height 6\' 2"  (1.88 m), weight 96.2 kg (212 lb), SpO2 99 %.    PHYSICAL EXAMINATION:  GENERAL:  46 y.o.-year-old patient lying in the bed with no acute distress.  EYES: Pupils equal, round, reactive to light and accommodation. No scleral icterus. Extraocular muscles intact.  HEENT: Head atraumatic,  normocephalic. Oropharynx and nasopharynx clear.  NECK:  Supple, no jugular venous distention. No thyroid enlargement, no tenderness.  LUNGS: Normal breath sounds bilaterally, no wheezing, rales,rhonchi or crepitation. No use of accessory muscles of respiration.  CARDIOVASCULAR: S1, S2 normal. No murmurs, rubs, or gallops.  ABDOMEN: Soft, non-tender, non-distended. Bowel sounds present. No organomegaly or mass.  EXTREMITIES: No pedal edema, cyanosis, or clubbing.  NEUROLOGIC: Cranial nerves II through XII are intact. Muscle strength 5/5 in all extremities. Sensation intact. Gait not checked.  PSYCHIATRIC: The patient is alert and oriented x 3.  SKIN: No obvious rash, lesion, or ulcer.   DATA REVIEW:   CBC  Recent Labs Lab 01/09/17 2006  WBC 8.7  HGB 14.5  HCT 41.6  PLT 318    Chemistries   Recent Labs Lab 01/09/17 2006  NA 139  K 3.6  CL 106  CO2 26  GLUCOSE 100*  BUN 17  CREATININE 0.97  CALCIUM 9.2  AST 35  ALT 43  ALKPHOS 68  BILITOT 0.9    Cardiac Enzymes  Recent Labs Lab 01/09/17 2006  TROPONINI <0.03      RADIOLOGY:  Ct Head Wo Contrast  Result Date: 01/09/2017 CLINICAL DATA:  Left side facial numbness.  Vision changes. EXAM: CT HEAD WITHOUT CONTRAST TECHNIQUE: Contiguous axial images were obtained from the base of the skull through the vertex without intravenous contrast. COMPARISON:  None. FINDINGS: Brain: No evidence of acute infarction, hemorrhage, hydrocephalus, extra-axial collection or mass lesion/mass effect. Vascular: No hyperdense vessel or unexpected calcification. Skull: Normal. Negative for fracture or focal lesion. Sinuses/Orbits: No acute finding. Other: None. IMPRESSION: 1. No acute intracranial abnormality.  Normal brain Electronically Signed   By: Signa Kell M.D.   On: 01/09/2017 20:04   Mr Angiogram Neck W Contrast  Result Date: 01/10/2017 CLINICAL DATA:  Initial evaluation for left-sided weakness and numbness. EXAM: MR HEAD  WITHOUT CONTRAST MRA OF THE NECK WITHOUT AND WITH CONTRAST TECHNIQUE: Multiplanar, multiecho pulse sequences of the brain and surrounding structures were obtained without intravenous contrast. Angiographic images of the neck were obtained using MRA technique without and with intravenous contrast. CONTRAST:  20mL MULTIHANCE GADOBENATE DIMEGLUMINE 529 MG/ML IV SOLN COMPARISON:  Comparison made with prior CT from 01/09/2017 and MRA from earlier the same day. FINDINGS: MR HEAD FINDINGS Brain: Cerebral volume normal. No focal parenchymal signal abnormality identified. No significant cerebral white matter disease for age. No abnormal foci of restricted diffusion to suggest acute or subacute ischemia. Gray-white matter differentiation well maintained. No encephalomalacia to suggest chronic infarction. No foci of susceptibility artifact to suggest acute or chronic intracranial hemorrhage. No mass lesion, midline shift or mass effect. Ventricles normal size without evidence for hydrocephalus. No extra-axial fluid collection. Major dural sinuses are grossly patent. Pituitary gland suprasellar region within normal limits. Midline structures intact and normal. Vascular: Major intracranial vascular flow voids are maintained. Skull and upper cervical spine: Craniocervical junction normal. Visualized upper cervical spine unremarkable. Bone marrow signal intensity within normal limits. No scalp soft tissue abnormality. Sinuses/Orbits: Globes  and orbital soft tissues within normal limits. Scattered mucosal thickening throughout the paranasal sinuses, greatest within the ethmoidal air cells. No air-fluid level to suggest active sinus infection. No mastoid effusion. Inner ear structures grossly normal. Other: None. MRA NECK FINDINGS Visualized portions of the aortic arch are of normal caliber with normal 3 vessel morphology. No significant stenosis about the origin of the great vessels. Visualized subclavian arteries widely patent.  Right common and internal carotid artery use widely patent to the skullbase without flow-limiting stenosis. No significant atheromatous narrowing about the right carotid bifurcation. Left common and internal carotid artery is widely patent to the skullbase without flow-limiting stenosis. No significant atheromatous narrowing about the left carotid bifurcation. Both of the vertebral arteries resting subclavian arteries. Right vertebral artery slightly dominant. Vertebral arteries patent within the neck with antegrade flow without flow-limiting stenosis or occlusion. IMPRESSION: 1. Normal MRI of the brain. No acute intracranial infarct or other process identified. 2. Normal MRA of the neck. Electronically Signed   By: Rise Mu M.D.   On: 01/10/2017 14:09   Mr Brain Wo Contrast  Result Date: 01/10/2017 CLINICAL DATA:  Initial evaluation for left-sided weakness and numbness. EXAM: MR HEAD WITHOUT CONTRAST MRA OF THE NECK WITHOUT AND WITH CONTRAST TECHNIQUE: Multiplanar, multiecho pulse sequences of the brain and surrounding structures were obtained without intravenous contrast. Angiographic images of the neck were obtained using MRA technique without and with intravenous contrast. CONTRAST:  20mL MULTIHANCE GADOBENATE DIMEGLUMINE 529 MG/ML IV SOLN COMPARISON:  Comparison made with prior CT from 01/09/2017 and MRA from earlier the same day. FINDINGS: MR HEAD FINDINGS Brain: Cerebral volume normal. No focal parenchymal signal abnormality identified. No significant cerebral white matter disease for age. No abnormal foci of restricted diffusion to suggest acute or subacute ischemia. Gray-white matter differentiation well maintained. No encephalomalacia to suggest chronic infarction. No foci of susceptibility artifact to suggest acute or chronic intracranial hemorrhage. No mass lesion, midline shift or mass effect. Ventricles normal size without evidence for hydrocephalus. No extra-axial fluid collection.  Major dural sinuses are grossly patent. Pituitary gland suprasellar region within normal limits. Midline structures intact and normal. Vascular: Major intracranial vascular flow voids are maintained. Skull and upper cervical spine: Craniocervical junction normal. Visualized upper cervical spine unremarkable. Bone marrow signal intensity within normal limits. No scalp soft tissue abnormality. Sinuses/Orbits: Globes and orbital soft tissues within normal limits. Scattered mucosal thickening throughout the paranasal sinuses, greatest within the ethmoidal air cells. No air-fluid level to suggest active sinus infection. No mastoid effusion. Inner ear structures grossly normal. Other: None. MRA NECK FINDINGS Visualized portions of the aortic arch are of normal caliber with normal 3 vessel morphology. No significant stenosis about the origin of the great vessels. Visualized subclavian arteries widely patent. Right common and internal carotid artery use widely patent to the skullbase without flow-limiting stenosis. No significant atheromatous narrowing about the right carotid bifurcation. Left common and internal carotid artery is widely patent to the skullbase without flow-limiting stenosis. No significant atheromatous narrowing about the left carotid bifurcation. Both of the vertebral arteries resting subclavian arteries. Right vertebral artery slightly dominant. Vertebral arteries patent within the neck with antegrade flow without flow-limiting stenosis or occlusion. IMPRESSION: 1. Normal MRI of the brain. No acute intracranial infarct or other process identified. 2. Normal MRA of the neck. Electronically Signed   By: Rise Mu M.D.   On: 01/10/2017 14:09   Mr Maxine Glenn Head/brain ZO Cm  Result Date: 01/10/2017 CLINICAL DATA:  Acute onset LEFT-sided  weakness and blurry vision, mild confusion. Evaluate transient ischemic attack. EXAM: MRA HEAD WITHOUT CONTRAST TECHNIQUE: Angiographic images of the [place] were obtained using MRA technique without intravenous contrast. COMPARISON:  CT HEAD Jan 09, 2017 FINDINGS: Moderately motion degraded examination. ANTERIOR CIRCULATION: Flow related enhancement bilateral internal carotid artery's, patent anterior and middle cerebral artery's. Patent anterior communicating artery. No large vessel occlusion or high-grade stenosis of the proximal vessels. POSTERIOR CIRCULATION: RIGHT vertebral artery is dominant. Patent vertebral artery's and basilar artery. Limited assessment of branch vessels due to motion. Robust RIGHT posterior communicating artery. Patent bilateral posterior cerebral artery's. No large vessel occlusion or high-grade stenosis of the proximal vessels. 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 Source images and MIP images were reviewed. IMPRESSION: Moderately motion degraded examination without emergent large vessel occlusion or severe stenosis. Electronically Signed   By: Awilda Metro M.D.   On: 01/10/2017 01:53    Management plans discussed with the patient, family and they are in agreement.  CODE STATUS:     Code Status Orders        Start     Ordered   01/09/17 2232  Full code  Continuous     01/09/17 2231    Code Status History    Date Active Date Inactive Code Status Order ID Comments User Context   This patient has a current code status but no historical code status.      TOTAL TIME TAKING CARE OF THIS PATIENT: 35 minutes.    Alford Highland M.D on 01/10/2017 at 2:25 PM  Between 7am to 6pm - Pager - [phone]  After 6pm go to [email or web address] - password [email or web address]  [facility] Office  [phone]  CC: Primary care physician; Dr. Gillermo Murdoch

## 2017-01-10 NOTE — Progress Notes (Signed)
OT Cancellation Note  Patient Details Name: Cyndi Benderedro Sturdivant MRN: [id number] DOB: 09/01/1970   Cancelled Treatment:    Reason Eval/Treat Not Completed: Patient at procedure or test/ unavailable. Order received, chart reviewed. Pt out of room for testing. Will re-attempt OT evaluation at later date/time as pt is available.  Richrd PrimeJamie Stiller, MPH, MS, OTR/L ascom [phone] 01/10/17, 12:10 PM

## 2017-01-10 NOTE — Progress Notes (Signed)
SLP Cancellation Note  Patient Details Name: Rodney Huff MRN: [id number] DOB: February 16, 1971   Cancelled treatment:       Reason Eval/Treat Not Completed: SLP screened, no needs identified, will sign off (chart reviewed; consulted NSG then met w/ pt/family)Pt and family denied any speech-language deficits; pt felt he was conversing at conversational level appropriately. Pt also denied any swallowing deficits since admission.  No further skilled ST services indicated at this time; pt and NSG to reconsult if any questions, concerns or change in status while admitted. Pt and family agreed.   Orinda Kenner, MS, CCC-SLP Watson,Katherine 01/10/2017, 10:36 AM

## 2017-01-10 NOTE — Progress Notes (Signed)
*  PRELIMINARY RESULTS* Echocardiogram 2D Echocardiogram has been performed.  Cristela BlueHege, Janita Camberos 01/10/2017, 3:07 PM

## 2017-01-10 NOTE — Consult Note (Signed)
Referring Physician: Renae Gloss    Chief Complaint: Left sided weakness and loss of vision in left eye  HPI: Rodney Huff is an 46 y.o. male that he was at a banquet tonight when he experienced sudden onset left-sided blurring of vision, facial numbness, left arm weakness and numbness, and some mild confusion. He states that he felt like something was wrong, and he tried to reach to the person sitting on his left but could not get his arm to work. He had difficulty seeing that person as well. His wife was seated to the right of him, and he was able to get her attention and uses right side without difficulty. His wife states that he did not have slurred speech, but that sometimes when she would ask him a question he simply would not respond. He then started to get hot and felt as if he was about to pass out.  Patient states that the symptoms lasted somewhere about 20-25 minutes before he had returned to what he feels like is his baseline neurological status. He has had a significant headache afterwards. He describes the headache as frontal and throbbing.  He does have a history of frontal headache but has not had one in months.  No previous associated focal neurological complaints.     Date last known well: Date: 01/09/2017 Time last known well: Time: 19:29 tPA Given: No: Resolution of symptoms  Past Medical History:  Diagnosis Date  . Missing teeth, acquired     Past Surgical History:  Procedure Laterality Date  . NO PAST SURGERIES      Family History  Problem Relation Age of Onset  . Lung cancer Father   . Colon cancer Father    Social History:  reports that he has never smoked. He has never used smokeless tobacco. He reports that he does not drink alcohol or use drugs.  Allergies: No Known Allergies  Medications:  I have reviewed the patient's current medications. Prior to Admission:  No prescriptions prior to admission.   Scheduled: . aspirin EC  81 mg Oral Daily  . atorvastatin   40 mg Oral q1800  . enoxaparin (LOVENOX) injection  40 mg Subcutaneous Q24H    ROS: History obtained from the patient  General ROS: negative for - chills, fatigue, fever, night sweats, weight gain or weight loss Psychological ROS: negative for - behavioral disorder, hallucinations, memory difficulties, mood swings or suicidal ideation Ophthalmic ROS: negative for - blurry vision, double vision, eye pain or loss of vision ENT ROS: negative for - epistaxis, nasal discharge, oral lesions, sore throat, tinnitus or vertigo Allergy and Immunology ROS: negative for - hives or itchy/watery eyes Hematological and Lymphatic ROS: negative for - bleeding problems, bruising or swollen lymph nodes Endocrine ROS: negative for - galactorrhea, hair pattern changes, polydipsia/polyuria or temperature intolerance Respiratory ROS: negative for - cough, hemoptysis, shortness of breath or wheezing Cardiovascular ROS: negative for - chest pain, dyspnea on exertion, edema or irregular heartbeat Gastrointestinal ROS: negative for - abdominal pain, diarrhea, hematemesis, nausea/vomiting or stool incontinence Genito-Urinary ROS: negative for - dysuria, hematuria, incontinence or urinary frequency/urgency Musculoskeletal ROS: negative for - joint swelling or muscular weakness Neurological ROS: as noted in HPI, occasional tingling right hand Dermatological ROS: negative for rash and skin lesion changes  Physical Examination: Blood pressure 126/73, pulse 72, temperature 98 F (36.7 C), temperature source Oral, resp. rate 20, height 6\' 2"  (1.88 m), weight 96.2 kg (212 lb), SpO2 99 %.  HEENT-  Normocephalic, no lesions,  without obvious abnormality.  Normal external eye and conjunctiva.  Normal TM's bilaterally.  Normal auditory canals and external ears. Normal external nose, mucus membranes and septum.  Normal pharynx. Cardiovascular- S1, S2 normal, pulses palpable throughout   Lungs- chest clear, no wheezing, rales,  normal symmetric air entry Abdomen- soft, non-tender; bowel sounds normal; no masses,  no organomegaly Extremities- no edema Lymph-no adenopathy palpable Musculoskeletal-no joint tenderness, deformity or swelling Skin-warm and dry, no hyperpigmentation, vitiligo, or suspicious lesions  Neurological Examination   Mental Status: Alert, oriented, thought content appropriate.  Speech fluent without evidence of aphasia.  Able to follow 3 step commands without difficulty. Cranial Nerves: II: Discs flat bilaterally; Visual fields grossly normal, pupils equal, round, reactive to light and accommodation III,IV, VI: ptosis not present, extra-ocular motions intact bilaterally V,VII: smile symmetric, facial light touch sensation normal bilaterally VIII: hearing normal bilaterally IX,X: gag reflex present XI: bilateral shoulder shrug XII: midline tongue extension Motor: Right : Upper extremity   5/5    Left:     Upper extremity   5/5  Lower extremity   5/5     Lower extremity   5/5 Tone and bulk:normal tone throughout; no atrophy noted Sensory: Pinprick and light touch intact throughout, bilaterally Deep Tendon Reflexes: 2+ and symmetric throughout Plantars: Right: downgoing   Left: downgoing Cerebellar: Normal finger-to-nose and normal heel-to-shin testing bilaterally Gait: normal gait and station    Laboratory Studies:  Basic Metabolic Panel:  Recent Labs Lab 01/09/17 2006  NA 139  K 3.6  CL 106  CO2 26  GLUCOSE 100*  BUN 17  CREATININE 0.97  CALCIUM 9.2    Liver Function Tests:  Recent Labs Lab 01/09/17 2006  AST 35  ALT 43  ALKPHOS 68  BILITOT 0.9  PROT 7.9  ALBUMIN 4.6   No results for input(s): LIPASE, AMYLASE in the last 168 hours. No results for input(s): AMMONIA in the last 168 hours.  CBC:  Recent Labs Lab 01/09/17 2006  WBC 8.7  NEUTROABS 4.7  HGB 14.5  HCT 41.6  MCV 86.1  PLT 318    Cardiac Enzymes:  Recent Labs Lab 01/09/17 2006   TROPONINI <0.03    BNP: Invalid input(s): POCBNP  CBG: No results for input(s): GLUCAP in the last 168 hours.  Microbiology: No results found for this or any previous visit.  Coagulation Studies:  Recent Labs  01/09/17 2006  LABPROT 13.0  INR 0.98    Urinalysis:  Recent Labs Lab 01/10/17 0800  COLORURINE YELLOW*  LABSPEC 1.029  PHURINE 6.0  GLUCOSEU NEGATIVE  HGBUR NEGATIVE  BILIRUBINUR NEGATIVE  KETONESUR NEGATIVE  PROTEINUR NEGATIVE  NITRITE NEGATIVE  LEUKOCYTESUR NEGATIVE    Lipid Panel:    Component Value Date/Time   CHOL 140 01/10/2017 0539   TRIG 57 01/10/2017 0539   HDL 28 (L) 01/10/2017 0539   CHOLHDL 5.0 01/10/2017 0539   VLDL 11 01/10/2017 0539   LDLCALC 101 (H) 01/10/2017 0539    HgbA1C: No results found for: HGBA1C  Urine Drug Screen:     Component Value Date/Time   LABOPIA NONE DETECTED 01/10/2017 0800   COCAINSCRNUR NONE DETECTED 01/10/2017 0800   LABBENZ NONE DETECTED 01/10/2017 0800   AMPHETMU NONE DETECTED 01/10/2017 0800   THCU NONE DETECTED 01/10/2017 0800   LABBARB NONE DETECTED 01/10/2017 0800    Alcohol Level:  Recent Labs Lab 01/09/17 2006  ETH <5    Other results: EKG: sinus rhythm at 90 bpm.  Imaging: Ct Head Wo  Contrast  Result Date: 01/09/2017 CLINICAL DATA:  Left side facial numbness.  Vision changes. EXAM: CT HEAD WITHOUT CONTRAST TECHNIQUE: Contiguous axial images were obtained from the base of the skull through the vertex without intravenous contrast. COMPARISON:  None. FINDINGS: Brain: No evidence of acute infarction, hemorrhage, hydrocephalus, extra-axial collection or mass lesion/mass effect. Vascular: No hyperdense vessel or unexpected calcification. Skull: Normal. Negative for fracture or focal lesion. Sinuses/Orbits: No acute finding. Other: None. IMPRESSION: 1. No acute intracranial abnormality.  Normal brain Electronically Signed   By: Signa Kell M.D.   On: 01/09/2017 20:04   Mr Maxine Glenn Head/brain ZO  Cm  Result Date: 01/10/2017 CLINICAL DATA:  Acute onset LEFT-sided weakness and blurry vision, mild confusion. Evaluate transient ischemic attack. EXAM: MRA HEAD WITHOUT CONTRAST TECHNIQUE: Angiographic images of the Circle of Willis were obtained using MRA technique without intravenous contrast. COMPARISON:  CT HEAD Jan 09, 2017 FINDINGS: Moderately motion degraded examination. ANTERIOR CIRCULATION: Flow related enhancement bilateral internal carotid artery's, patent anterior and middle cerebral artery's. Patent anterior communicating artery. No large vessel occlusion or high-grade stenosis of the proximal vessels. POSTERIOR CIRCULATION: RIGHT vertebral artery is dominant. Patent vertebral artery's and basilar artery. Limited assessment of branch vessels due to motion. Robust RIGHT posterior communicating artery. Patent bilateral posterior cerebral artery's. No large vessel occlusion or high-grade stenosis of the proximal vessels. 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 Source images and MIP images were reviewed. IMPRESSION: Moderately motion degraded examination without emergent large vessel occlusion or severe stenosis. Electronically Signed   By: Awilda Metro M.D.   On: 01/10/2017 01:53    Assessment: 46 y.o. male presenting with headache and left eye vision loss/left hemiparesis.  Symptoms have resolved.  Head CT reviewed and shows no acute changes.  MRI reviewed and is unremarkable a well.  MRA shows no significant stenosis.  Suspect symptoms related to a complicated migraine.   LDL 101.  Echocardiogram pending  Stroke Risk Factors - none  Plan: 1. ASA 81mg  daily 2. Statin for lipid management with target LDL<70. 3. Follow up with neurology on an outpatient basis.  Case discussed with Dr. Yetta Barre, MD Neurology [phone] 01/10/2017, 1:59 PM

## 2017-01-10 NOTE — Progress Notes (Signed)
Discharge instructions along with home medications and follow up gone over with patient and wife. Both verbalize that they understood instructions. 2 prescriptions given to patient. IV and tele removed. Pt being discharged home on room air, no distress noted. Otilio JeffersonMadelyn S Fenton, RN

## 2017-01-11 LAB — HEMOGLOBIN A1C
HEMOGLOBIN A1C: 5.5 % (ref 4.8–5.6)
Mean Plasma Glucose: 111 mg/dL

## 2017-01-11 LAB — HIV ANTIBODY (ROUTINE TESTING W REFLEX): HIV Screen 4th Generation wRfx: NONREACTIVE

## 2017-01-14 ENCOUNTER — Encounter: Payer: Self-pay | Admitting: Neurology

## 2017-01-18 ENCOUNTER — Encounter: Payer: Self-pay | Admitting: Neurology

## 2017-01-18 ENCOUNTER — Other Ambulatory Visit: Payer: BLUE CROSS/BLUE SHIELD

## 2017-01-18 ENCOUNTER — Ambulatory Visit (INDEPENDENT_AMBULATORY_CARE_PROVIDER_SITE_OTHER): Payer: BLUE CROSS/BLUE SHIELD | Admitting: Neurology

## 2017-01-18 VITALS — BP 114/82 | HR 85 | Ht 74.0 in | Wt 218.0 lb

## 2017-01-18 DIAGNOSIS — R5383 Other fatigue: Secondary | ICD-10-CM

## 2017-01-18 DIAGNOSIS — G459 Transient cerebral ischemic attack, unspecified: Secondary | ICD-10-CM

## 2017-01-18 DIAGNOSIS — G43109 Migraine with aura, not intractable, without status migrainosus: Secondary | ICD-10-CM | POA: Diagnosis not present

## 2017-01-18 LAB — TSH: TSH: 1.16 m[IU]/L (ref 0.40–4.50)

## 2017-01-18 NOTE — Patient Instructions (Signed)
1. Schedule 30-day holter monitor 2. Bloodwork for TSH, B12 3. Reduce atorvastatin to 1/2 tablet daily 4. Continue daily aspirin 81mg  5. If symptoms change, go to ER immediately 6. Our office will call you for results. Follow-up in 6 months, call for any changes

## 2017-01-18 NOTE — Progress Notes (Signed)
NEUROLOGY CONSULTATION NOTE  Rodney Huff MRN: [id number] DOB: [birth year]  Referring provider: Dr. Alford Highland Primary care provider: none  Reason for consult:  TIA versus complicated migraine  Dear Dr Renae Gloss:  Thank you for your kind referral of Rodney Huff for consultation of the above symptoms. Although his history is well known to you, please allow me to reiterate it for the purpose of our medical record. The patient was accompanied to the clinic by his wife who also provides collateral information. Records and images were personally reviewed where available.  HISTORY OF PRESENT ILLNESS: This is a pleasant 46 year old right-handed man with no significant past medical history, presenting after hospital admission for TIA versus complicated migraine last 01/09/2017. He was at his daughter's sport banquet, after eating, he noticed his left eye was blurry and hazy, then the left side of his face and left arm started getting numb and heavy. His leg did not seem involved, he sat down but symptoms progressed where he could not see anything out of his left eye because things were very blurry. He called his wife's attention, who noted his left eye was deviated to the left, while his right eye was looking straight at her. Before he stood up, he started having a slight headache that progressed to a 10/10 midfrontal throbbing pain, no associated nausea/vomiting. They walked outside and he stumbled a little, leaning against chairs. Symptoms started clearing up in 15 minutes except for the headaches, his wife gave him an Advil. He started feeling very hot 3-4 minutes later, they went to the ER where headache eventually subsided. His exam was normal in the ER, he was admitted for TIA workup. I personally reviewed MRI brain without contrast which was normal, no acute infarcts, MRA head and neck were normal, no stenosis seen. Echo was normal, normal left atrium. His LDL was 101, he was discharged home on  atorvastatin 40mg  daily and aspirin 81 mg daily. BP in the hospital was elevated, he was slightly elevated, normal on hospital discharge. HbA1c 5.5.   He reports having headaches once in a while, but none like this one. His sister has migraines. No prior history of migraines. He denies any further similar symptoms, but feels tired in the afternoon at 2pm, needing a nap. He snores, his wife denies any apnea. He had a mild headache today. He denies any further vision changes. He denies any dizziness, dysarthria/dysphagia, neck/back pain, bowel/bladder dysfunction. His father, paternal grandfather, and both grandmothers had strokes.   Laboratory Data: Lab Results  Component Value Date   WBC 8.7 01/09/2017   HGB 14.5 01/09/2017   HCT 41.6 01/09/2017   MCV 86.1 01/09/2017   PLT 318 01/09/2017     Chemistry      Component Value Date/Time   NA 139 01/09/2017 2006   K 3.6 01/09/2017 2006   CL 106 01/09/2017 2006   CO2 26 01/09/2017 2006   BUN 17 01/09/2017 2006   CREATININE 0.97 01/09/2017 2006      Component Value Date/Time   CALCIUM 9.2 01/09/2017 2006   ALKPHOS 68 01/09/2017 2006   AST 35 01/09/2017 2006   ALT 43 01/09/2017 2006   BILITOT 0.9 01/09/2017 2006     Lab Results  Component Value Date   CHOL 140 01/10/2017   HDL 28 (L) 01/10/2017   LDLCALC 101 (H) 01/10/2017   TRIG 57 01/10/2017   CHOLHDL 5.0 01/10/2017   Lab Results  Component Value Date   HGBA1C 5.5  01/10/2017     PAST MEDICAL HISTORY: Past Medical History:  Diagnosis Date  . Missing teeth, acquired     PAST SURGICAL HISTORY: Past Surgical History:  Procedure Laterality Date  . NO PAST SURGERIES      MEDICATIONS: Current Outpatient Prescriptions on File Prior to Visit  Medication Sig Dispense Refill  . aspirin EC 81 MG EC tablet Take 1 tablet (81 mg total) by mouth daily. 30 tablet 0  . atorvastatin (LIPITOR) 40 MG tablet Take 1 tablet (40 mg total) by mouth daily at 6 PM. 30 tablet 0   No  current facility-administered medications on file prior to visit.     ALLERGIES: No Known Allergies  FAMILY HISTORY: Family History  Problem Relation Age of Onset  . Lung cancer Father   . Colon cancer Father     SOCIAL HISTORY: Social History   Social History  . Marital status: Married    Spouse name: N/A  . Number of children: N/A  . Years of education: N/A   Occupational History  . Not on file.   Social History Main Topics  . Smoking status: Never Smoker  . Smokeless tobacco: Never Used  . Alcohol use No  . Drug use: No  . Sexual activity: Not on file   Other Topics Concern  . Not on file   Social History Narrative  . No narrative on file    REVIEW OF SYSTEMS: Constitutional: No fevers, chills, or sweats, no generalized fatigue, change in appetite Eyes: No visual changes, double vision, eye pain Ear, nose and throat: No hearing loss, ear pain, nasal congestion, sore throat Cardiovascular: No chest pain, palpitations Respiratory:  No shortness of breath at rest or with exertion, wheezes GastrointestinaI: No nausea, vomiting, diarrhea, abdominal pain, fecal incontinence Genitourinary:  No dysuria, urinary retention or frequency Musculoskeletal:  No neck pain, back pain Integumentary: No rash, pruritus, skin lesions Neurological: as above Psychiatric: No depression, insomnia, anxiety Endocrine: No palpitations, fatigue, diaphoresis, mood swings, change in appetite, change in weight, increased thirst Hematologic/Lymphatic:  No anemia, purpura, petechiae. Allergic/Immunologic: no itchy/runny eyes, nasal congestion, recent allergic reactions, rashes  PHYSICAL EXAM: Vitals:   01/18/17 1253  BP: 114/82  Pulse: 85   General: No acute distress Head:  Normocephalic/atraumatic Eyes: Fundoscopic exam shows bilateral sharp discs, no vessel changes, exudates, or hemorrhages Neck: supple, no paraspinal tenderness, full range of motion Back: No paraspinal  tenderness Heart: regular rate and rhythm Lungs: Clear to auscultation bilaterally. Vascular: No carotid bruits. Skin/Extremities: No rash, no edema Neurological Exam: Mental status: alert and oriented to person, place, and time, no dysarthria or aphasia, Fund of knowledge is appropriate.  Recent and remote memory are intact.  Attention and concentration are normal.    Able to name objects and repeat phrases. Cranial nerves: CN I: not tested CN II: pupils equal, round and reactive to light, visual fields intact, fundi unremarkable. CN III, IV, VI:  full range of motion, no nystagmus, no ptosis CN V: facial sensation intact CN VII: upper and lower face symmetric CN VIII: hearing intact to finger rub CN IX, X: gag intact, uvula midline CN XI: sternocleidomastoid and trapezius muscles intact CN XII: tongue midline Bulk & Tone: normal, no fasciculations. Motor: 5/5 throughout with no pronator drift. Sensation: intact to light touch, cold, pin, vibration and joint position sense.  No extinction to double simultaneous stimulation.  Romberg test negative Deep Tendon Reflexes: +2 throughout, no ankle clonus Plantar responses: downgoing bilaterally Cerebellar: no incoordination  on finger to nose, heel to shin. No dysdiadochokinesia Gait: narrow-based and steady, able to tandem walk adequately. Tremor: none  IMPRESSION: This is a pleasant 46 year old right-handed man with no significant past medical history presenting with a transient episode of left eye blurred vision followed by left face and arm numbness and heaviness. This was followed by a significant headache. He was back to normal except for headache on arrival to the ER last 01/09/2017. MRI brain normal, stroke workup unremarkable with normal MRA head and neck, echocardiogram. Symptoms concerning for TIA. Complicated migraine was also considered due to significant headache after, however he has no prior history of migraines, atypical to start  having migraines after age 46. Recommend 30-day holter monitor to assess for any abnormal arrhythmia as part of TIA workup. Continue daily aspirin 81mg , we discussed control of vascular risk factors, continue lipid control, goal LDL <70, BP today 114/82.Marland Kitchen He is feeling tired daily since then, check TSH, B12. He will try reducing atorvastatin dose to 20mg  daily. He knows to go to the ER immediately for any change in symptoms. If symptoms recur and are associated again bad headache, we will plan for migraine prophylactic treatment. He was advised to set up a visit with a PCP. He will follow-up in 6 months and knows to call for any changes.   Thank you for allowing me to participate in the care of this patient. Please do not hesitate to call for any questions or concerns.   Patrcia Dolly, M.D.  CC: Dr. Renae Gloss

## 2017-01-19 LAB — VITAMIN B12: VITAMIN B 12: 561 pg/mL (ref 200–1100)

## 2017-01-21 ENCOUNTER — Telehealth: Payer: Self-pay | Admitting: Neurology

## 2017-01-21 ENCOUNTER — Other Ambulatory Visit: Payer: Self-pay

## 2017-01-21 ENCOUNTER — Encounter: Payer: Self-pay | Admitting: Neurology

## 2017-01-21 DIAGNOSIS — G459 Transient cerebral ischemic attack, unspecified: Secondary | ICD-10-CM

## 2017-01-21 MED ORDER — ATORVASTATIN CALCIUM 40 MG PO TABS: ORAL_TABLET | ORAL | 0 refills | Status: DC

## 2017-01-21 NOTE — Telephone Encounter (Signed)
Patient wife called and states that they are leaving the country and will need a refill on the aspirin and lipitor  Before they leave [facility] into [facility]  [facility] [address]

## 2017-01-21 NOTE — Telephone Encounter (Signed)
Rx sent to pt listed preferred pharmacy.  Spoke with pt, letting him know about the Rx as well as the change in dosage per Dr. Karel JarvisAquino, 40mg  Tablet, ZOX:WRUEsig:take 1/2 tablet every evening.  Also let pt know that he should be fine using any over the counter aspirin.

## 2017-01-22 ENCOUNTER — Telehealth: Payer: Self-pay

## 2017-01-22 NOTE — Telephone Encounter (Signed)
-----   Message from Van ClinesKaren M Aquino, MD sent at 01/21/2017  3:22 PM EDT ----- Pls let him know thyroid and B12 levels are normal. Thanks

## 2017-01-22 NOTE — Telephone Encounter (Signed)
Spoke with pt, relaying message below.  Pt appreciative. 

## 2017-03-04 ENCOUNTER — Ambulatory Visit: Payer: BLUE CROSS/BLUE SHIELD | Admitting: Neurology

## 2017-03-04 ENCOUNTER — Telehealth: Payer: Self-pay | Admitting: Neurology

## 2017-03-04 NOTE — Telephone Encounter (Signed)
Left message for Jasmine DecemberSharon at [facility] to call me back to schedule the appointment.

## 2017-03-04 NOTE — Telephone Encounter (Signed)
Patient came by the office to follow up on the heart monitor referral. He said he has not heard from anyone regarding this. Please call. Thanks

## 2017-03-06 ENCOUNTER — Other Ambulatory Visit: Payer: Self-pay | Admitting: *Deleted

## 2017-03-06 DIAGNOSIS — R5383 Other fatigue: Secondary | ICD-10-CM

## 2017-03-06 DIAGNOSIS — G459 Transient cerebral ischemic attack, unspecified: Secondary | ICD-10-CM

## 2017-03-11 ENCOUNTER — Other Ambulatory Visit: Payer: Self-pay | Admitting: *Deleted

## 2017-03-11 DIAGNOSIS — R5383 Other fatigue: Secondary | ICD-10-CM

## 2017-03-11 DIAGNOSIS — G459 Transient cerebral ischemic attack, unspecified: Secondary | ICD-10-CM

## 2017-03-21 ENCOUNTER — Other Ambulatory Visit: Payer: Self-pay | Admitting: Neurology

## 2017-03-21 ENCOUNTER — Ambulatory Visit (INDEPENDENT_AMBULATORY_CARE_PROVIDER_SITE_OTHER): Payer: BLUE CROSS/BLUE SHIELD

## 2017-03-21 DIAGNOSIS — R5383 Other fatigue: Secondary | ICD-10-CM

## 2017-03-21 DIAGNOSIS — I4891 Unspecified atrial fibrillation: Secondary | ICD-10-CM

## 2017-03-21 DIAGNOSIS — G459 Transient cerebral ischemic attack, unspecified: Secondary | ICD-10-CM | POA: Diagnosis not present

## 2017-07-23 ENCOUNTER — Ambulatory Visit: Payer: BLUE CROSS/BLUE SHIELD | Admitting: Neurology

## 2017-08-03 DIAGNOSIS — G43109 Migraine with aura, not intractable, without status migrainosus: Secondary | ICD-10-CM

## 2017-08-03 HISTORY — DX: Migraine with aura, not intractable, without status migrainosus: G43.109

## 2017-08-21 ENCOUNTER — Ambulatory Visit: Payer: BLUE CROSS/BLUE SHIELD | Admitting: Neurology

## 2018-06-03 ENCOUNTER — Ambulatory Visit: Payer: Self-pay | Admitting: Nurse Practitioner

## 2018-06-04 ENCOUNTER — Ambulatory Visit: Payer: Self-pay | Admitting: Nurse Practitioner

## 2018-06-05 ENCOUNTER — Encounter: Payer: Self-pay | Admitting: Nurse Practitioner

## 2018-06-05 ENCOUNTER — Other Ambulatory Visit: Payer: Self-pay | Admitting: Nurse Practitioner

## 2018-06-05 ENCOUNTER — Other Ambulatory Visit: Payer: Self-pay

## 2018-06-05 ENCOUNTER — Ambulatory Visit (INDEPENDENT_AMBULATORY_CARE_PROVIDER_SITE_OTHER): Payer: BLUE CROSS/BLUE SHIELD | Admitting: Nurse Practitioner

## 2018-06-05 VITALS — BP 112/61 | HR 74 | Temp 97.6°F | Ht 73.0 in | Wt 212.6 lb

## 2018-06-05 DIAGNOSIS — E782 Mixed hyperlipidemia: Secondary | ICD-10-CM

## 2018-06-05 DIAGNOSIS — Z7689 Persons encountering health services in other specified circumstances: Secondary | ICD-10-CM | POA: Diagnosis not present

## 2018-06-05 DIAGNOSIS — Z0000 Encounter for general adult medical examination without abnormal findings: Secondary | ICD-10-CM

## 2018-06-05 DIAGNOSIS — E663 Overweight: Secondary | ICD-10-CM | POA: Diagnosis not present

## 2018-06-05 DIAGNOSIS — Z131 Encounter for screening for diabetes mellitus: Secondary | ICD-10-CM

## 2018-06-05 HISTORY — DX: Mixed hyperlipidemia: E78.2

## 2018-06-05 HISTORY — DX: Overweight: E66.3

## 2018-06-05 NOTE — Patient Instructions (Addendum)
Rodney Akerson "[place]",   Thank you for coming in to clinic today.  1. Keep your scheduled physical. You will be due for FASTING BLOOD WORK.  This means you should eat no food or drink after midnight.  Drink only water or coffee without cream/sugar on the morning of your lab visit. - Please go ahead and schedule a "Lab Only" visit in the morning at the clinic for lab draw 3 days before your next Annual Physical. - Your results will be available about 2-3 days after blood draw.  If you have set up a MyChart account, you can can log in to MyChart online to view your results and a brief explanation. Also, we can discuss your results together at your next office visit if you would like.   Please schedule a follow-up appointment with Wilhelmina Mcardle, AGNP. Return for as scheduled for your physical with labs.  If you have any other questions or concerns, please feel free to call the clinic or send a message through MyChart. You may also schedule an earlier appointment if necessary.  You will receive a survey after today's visit either digitally by e-mail or paper by [employer]. Your experiences and feedback matter to [place].  Please respond so we know how we are doing as we provide care for you.   Wilhelmina Mcardle, DNP, AGNP-BC Adult Gerontology Nurse Practitioner [facility], [facility]

## 2018-06-05 NOTE — Progress Notes (Signed)
Subjective:    Patient ID: Rodney Huff, male    DOB: 1971-05-02, 47 y.o.   MRN: [id number]  Rodney Huff is a 47 y.o. male presenting on 06/05/2018 for Establish Care   HPI Establish Care New Provider Pt last seen by PCP many years ago.  Obtain records from [facility] for most recent GI and Neurololgy Care.  Records reviewed today. Patient is accompanied today by his wife.  Overweight BMI 28.1 Patient reports recent weight loss of 25 lbs in 30 days by following Keto diet.  He stopped exercise during that weight loss to reach goals faster (due to muscle mass).  He has not noted any weight gain since stopping his diet.  Is slowly adding carbohydrates back into his diet, but still largely avoids breads/pasta/sweets.  Hyperlipidemia/TIA Patient has history of TIA 12/2016, significant family history of stroke.  He has remained unconnected with healthcare since 2018 and has had no recent primary provider.  Has not had any new symptoms of TIA/CVA. - Pt denies changes in vision, chest tightness/pressure, palpitations, shortness of breath, leg pain while walking, leg or arm weakness, and sudden loss of speech or loss of consciousness.  - He is not currently taking any medications for cholesterol management. - Plans to resume physical activity at gym in the next week.  Previously was walking/running and using elliptical for 45-60 minutes 4 days per week.  Past Medical History:  Diagnosis Date  . Hyperlipidemia   . Missing teeth, acquired   . Stroke [facility]) 2017   Past Surgical History:  Procedure Laterality Date  . NO PAST SURGERIES     Social History   Socioeconomic History  . Marital status: Married    Spouse name: Toney Difatta  . Number of children: Not on file  . Years of education: Not on file  . Highest education level: 9th grade  Occupational History  . Not on file  Social Needs  . Financial resource strain: Not on file  . Food insecurity:    Worry: Not on file    Inability: Not  on file  . Transportation needs:    Medical: Not on file    Non-medical: Not on file  Tobacco Use  . Smoking status: Never Smoker  . Smokeless tobacco: Never Used  Substance and Sexual Activity  . Alcohol use: Never    Frequency: Never  . Drug use: Never  . Sexual activity: Not on file  Lifestyle  . Physical activity:    Days per week: Not on file    Minutes per session: Not on file  . Stress: Not on file  Relationships  . Social connections:    Talks on phone: Not on file    Gets together: Not on file    Attends religious service: Not on file    Active member of club or organization: Not on file    Attends meetings of clubs or organizations: Not on file    Relationship status: Not on file  . Intimate partner violence:    Fear of current or ex partner: Not on file    Emotionally abused: Not on file    Physically abused: Not on file    Forced sexual activity: Not on file  Other Topics Concern  . Not on file  Social History Narrative  . Not on file   Family History  Problem Relation Age of Onset  . Lung cancer Father        primary  . Colon cancer Father  primary  . Brain cancer Father        metastasis  . Throat cancer Father        metastasis  . Diabetes Mother   . Thyroid disease Mother   . Healthy Sister   . Healthy Sister   . CVA Maternal Grandmother   . CVA Paternal Grandmother   . CVA Paternal Grandfather    Current Outpatient Medications on File Prior to Visit  Medication Sig  . aspirin EC 81 MG EC tablet Take 1 tablet (81 mg total) by mouth daily.   No current facility-administered medications on file prior to visit.     Review of Systems  Constitutional: Negative for activity change, appetite change, fatigue and unexpected weight change.  HENT: Negative for congestion, hearing loss and trouble swallowing.   Eyes: Negative for visual disturbance.  Respiratory: Negative for choking, shortness of breath and wheezing.   Cardiovascular:  Negative for chest pain and palpitations.  Gastrointestinal: Negative for abdominal pain, blood in stool, constipation and diarrhea.  Genitourinary: Negative for difficulty urinating, discharge, flank pain, genital sores, penile pain, penile swelling, scrotal swelling and testicular pain.  Musculoskeletal: Negative for arthralgias, back pain and myalgias.  Skin: Negative for color change, rash and wound.  Allergic/Immunologic: Negative for environmental allergies.  Neurological: Negative for dizziness, seizures, weakness and headaches.  Psychiatric/Behavioral: Negative for behavioral problems, decreased concentration, dysphoric mood, sleep disturbance and suicidal ideas. The patient is not nervous/anxious.    Per HPI unless specifically indicated above    Objective:    BP 112/61   Pulse 74   Temp 97.6 F (36.4 C) (Oral)   Ht 6\' 1"  (1.854 m)   Wt 212 lb 9.6 oz (96.4 kg)   BMI 28.05 kg/m   Wt Readings from Last 3 Encounters:  06/05/18 212 lb 9.6 oz (96.4 kg)  01/18/17 218 lb (98.9 kg)  01/09/17 212 lb (96.2 kg)    Physical Exam  Constitutional: He is oriented to person, place, and time. He appears well-developed and well-nourished. No distress.  HENT:  Head: Normocephalic and atraumatic.  Cardiovascular: Normal rate, regular rhythm, S1 normal, S2 normal, normal heart sounds and intact distal pulses.  Pulmonary/Chest: Effort normal and breath sounds normal. No respiratory distress.  Neurological: He is alert and oriented to person, place, and time.  Skin: Skin is warm and dry. Capillary refill takes less than 2 seconds.  Psychiatric: He has a normal mood and affect. His behavior is normal. Judgment and thought content normal.  Vitals reviewed.  Results for orders placed or performed in visit on 01/18/17  TSH  Result Value Ref Range   TSH 1.16 0.40 - 4.50 mIU/L  Vitamin B12  Result Value Ref Range   Vitamin B-12 561 200 - 1,100 pg/mL      Assessment & Plan:   Problem List  Items Addressed This Visit      Other   Overweight with body mass index (BMI) 25.0-29.9 - Primary Improved with patient reported weight loss of 25 lbs using Keto crash diet over 30 days.  Currently maintained weight loss, has goal to continue losing about 5 more lbs.   - Encouraged moderation of carbs and fats off Keto diet.  Focus on lean meats, vegetables - Increase physical activity back to prior routine of at least 30 minutes most days of the week. - Follow-up prn.    Mixed dyslipidemia No recent check.  Labs ordered for review prior to physical.  Significant family history of CVA  and previously very low HDL indicates patient may also have similar risk given he has already had TIA.   - may benefit in future from coronary artery calcium scoring testing. - Lipid fasting prior to next visit - Consider resuming moderate intensity statin - Follow-up at next visit after labs.    Other Visit Diagnoses    Encounter to establish care     Previous PCP was many years ago.  Records will not be requested.  Recent specialist visits reviewed in [facility]  Past medical, family, and surgical history reviewed w/ patient in clinic today.        Follow up plan: Return for as scheduled for your physical with labs.  Wilhelmina Mcardle, DNP, AGPCNP-BC Adult Gerontology Primary Care Nurse Practitioner [facility] 06/05/2018, 4:56 PM

## 2018-06-13 ENCOUNTER — Other Ambulatory Visit: Payer: BLUE CROSS/BLUE SHIELD

## 2018-06-14 LAB — COMPLETE METABOLIC PANEL WITH GFR
AG Ratio: 1.8 (calc) (ref 1.0–2.5)
ALT: 23 U/L (ref 9–46)
AST: 18 U/L (ref 10–40)
Albumin: 4.6 g/dL (ref 3.6–5.1)
Alkaline phosphatase (APISO): 61 U/L (ref 40–115)
BUN: 14 mg/dL (ref 7–25)
CO2: 27 mmol/L (ref 20–32)
Calcium: 9.6 mg/dL (ref 8.6–10.3)
Chloride: 106 mmol/L (ref 98–110)
Creat: 1.08 mg/dL (ref 0.60–1.35)
GFR, Est African American: 96 mL/min/{1.73_m2} (ref >=60)
GFR, Est Non African American: 82 mL/min/{1.73_m2} (ref >=60)
Globulin: 2.6 g/dL (calc) (ref 1.9–3.7)
Glucose, Bld: 95 mg/dL (ref 65–99)
Potassium: 5.2 mmol/L (ref 3.5–5.3)
Sodium: 139 mmol/L (ref 135–146)
Total Bilirubin: 0.4 mg/dL (ref 0.2–1.2)
Total Protein: 7.2 g/dL (ref 6.1–8.1)

## 2018-06-14 LAB — LIPID PANEL
Cholesterol: 149 mg/dL (ref <200)
HDL: 33 mg/dL — ABNORMAL LOW (ref >40)
LDL Cholesterol (Calc): 100 mg/dL (calc) — ABNORMAL HIGH
Non-HDL Cholesterol (Calc): 116 mg/dL (calc) (ref <130)
Total CHOL/HDL Ratio: 4.5 (calc) (ref <5.0)
Triglycerides: 70 mg/dL (ref <150)

## 2018-06-14 LAB — CBC WITH DIFFERENTIAL/PLATELET
Basophils Absolute: 43 cells/uL (ref 0–200)
Basophils Relative: 0.7 %
Eosinophils Absolute: 183 cells/uL (ref 15–500)
Eosinophils Relative: 3 %
HCT: 40.6 % (ref 38.5–50.0)
Hemoglobin: 14.1 g/dL (ref 13.2–17.1)
Lymphs Abs: 1800 cells/uL (ref 850–3900)
MCH: 29.6 pg (ref 27.0–33.0)
MCHC: 34.7 g/dL (ref 32.0–36.0)
MCV: 85.3 fL (ref 80.0–100.0)
MPV: 10.5 fL (ref 7.5–12.5)
Monocytes Relative: 6.1 %
Neutro Abs: 3703 cells/uL (ref 1500–7800)
Neutrophils Relative %: 60.7 %
Platelets: 286 10*3/uL (ref 140–400)
RBC: 4.76 10*6/uL (ref 4.20–5.80)
RDW: 12.2 % (ref 11.0–15.0)
Total Lymphocyte: 29.5 %
WBC mixed population: 372 cells/uL (ref 200–950)
WBC: 6.1 10*3/uL (ref 3.8–10.8)

## 2018-06-14 LAB — HEMOGLOBIN A1C
Hgb A1c MFr Bld: 5.4 % of total Hgb (ref <5.7)
Mean Plasma Glucose: 108 (calc)
eAG (mmol/L): 6 (calc)

## 2018-06-14 LAB — PSA: PSA: 1.1 ng/mL (ref <=4.0)

## 2018-06-14 LAB — TSH: TSH: 1.31 mIU/L (ref 0.40–4.50)

## 2018-06-18 ENCOUNTER — Other Ambulatory Visit: Payer: Self-pay

## 2018-06-18 ENCOUNTER — Ambulatory Visit (INDEPENDENT_AMBULATORY_CARE_PROVIDER_SITE_OTHER): Payer: BLUE CROSS/BLUE SHIELD | Admitting: Nurse Practitioner

## 2018-06-18 ENCOUNTER — Encounter: Payer: Self-pay | Admitting: Nurse Practitioner

## 2018-06-18 VITALS — BP 115/84 | HR 85 | Temp 98.2°F | Ht 73.0 in | Wt 211.8 lb

## 2018-06-18 DIAGNOSIS — Z0000 Encounter for general adult medical examination without abnormal findings: Secondary | ICD-10-CM

## 2018-06-18 IMAGING — MR MR HEAD W/O CM
8 of 10 series · 32 of 48 positions shown · IV contrast (multihance)
Comparison: Comparison made with prior CT from 01/09/2017 and MRA
from earlier the same day.

CLINICAL DATA: Initial evaluation for left-sided weakness and
numbness.

EXAM:
MR HEAD WITHOUT CONTRAST
MRA OF THE NECK WITHOUT AND WITH CONTRAST
TECHNIQUE: Multiplanar, multiecho pulse sequences of the brain and surrounding
structures were obtained without intravenous contrast. Angiographic
images of the neck were obtained using MRA technique without and
with intravenous contrast.
CONTRAST:  20mL MULTIHANCE GADOBENATE DIMEGLUMINE 529 MG/ML IV SOLN

[Series 3: DWI · axial · 4.0mm · 0.94mm/px · z∈[-94,+60]mm · 7 of 40 slices shown (1 of 2)]
[im 1/40]
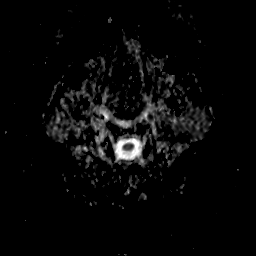
[im 7/40]
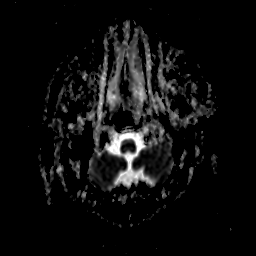
[im 14/40]
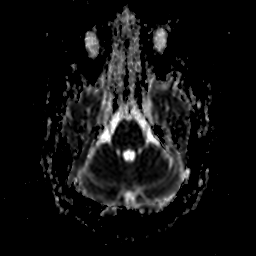
[im 20/40]
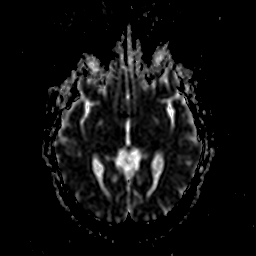
[im 27/40]
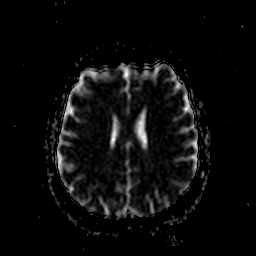
[im 33/40]
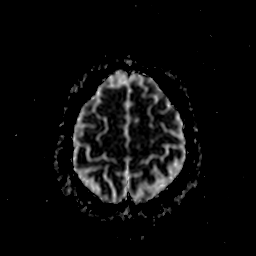
[im 40/40]
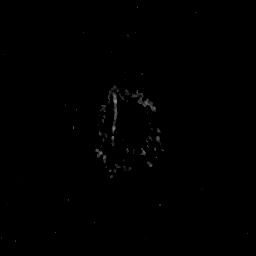

[Series 5: DWI · coronal · 5.0mm · 1.80mm/px · 5 of 35 slices shown (2 of 2)]
[im 1/35]
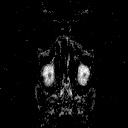
[im 9/35]
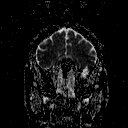
[im 18/35]
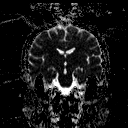
[im 26/35]
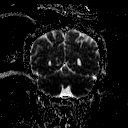
[im 35/35]
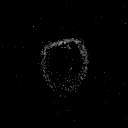

[Series 8: T1 · sagittal · 5.0mm · 0.45mm/px · 4 of 29 slices shown (1 of 2)]
[im 1/29]
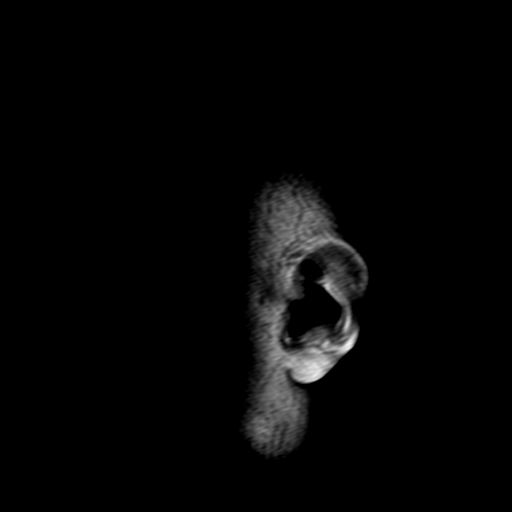
[im 10/29]
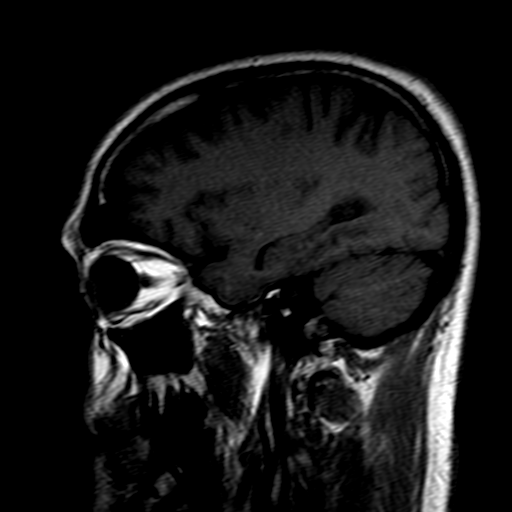
[im 19/29]
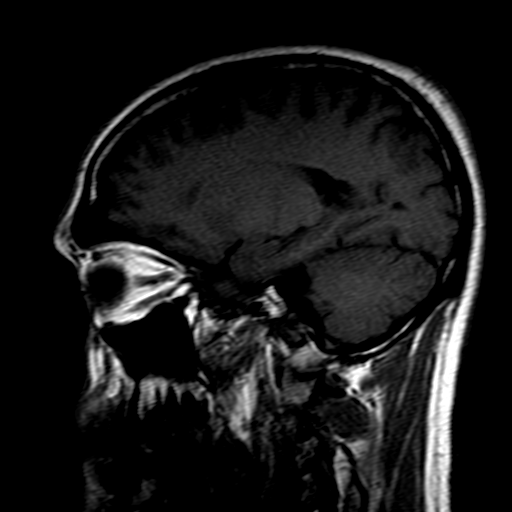
[im 29/29]
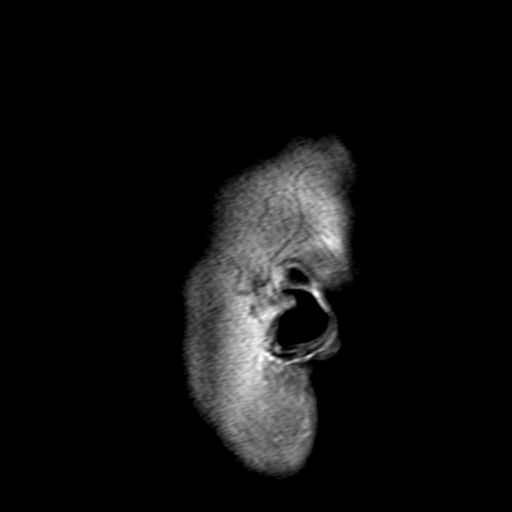

[Series 9: FLAIR · axial · 5.0mm · 0.90mm/px · z∈[-89,+58]mm · 3 of 24 slices shown]
[im 1/24]
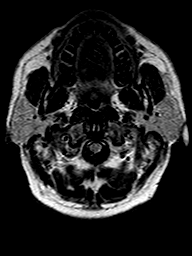
[im 12/24]
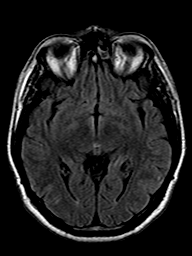
[im 24/24]
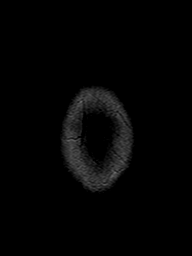

[Series 10: T2 · axial · 5.0mm · 0.45mm/px · z∈[-89,+58]mm · 3 of 24 slices shown (1 of 3)]
[im 1/24]
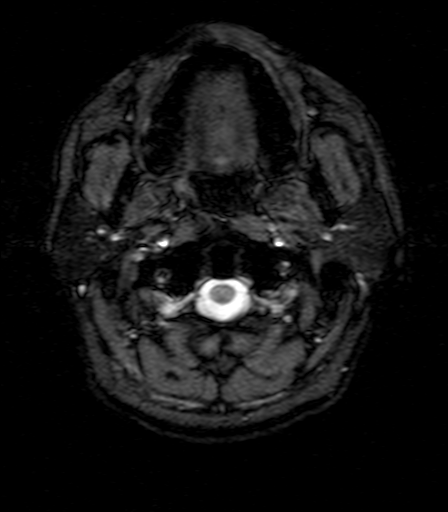
[im 12/24]
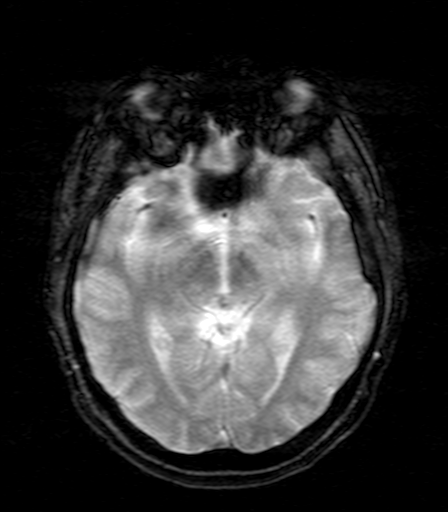
[im 24/24]
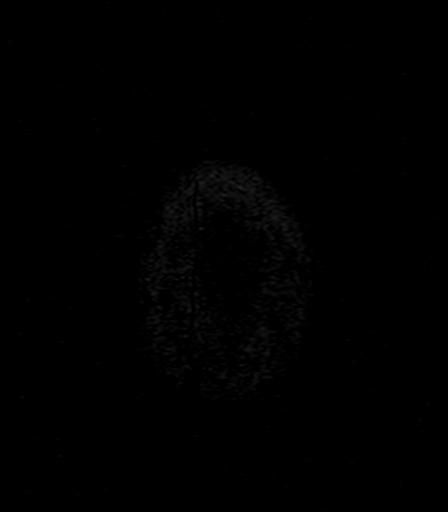

[Series 11: T2 · axial · 5.0mm · 0.45mm/px · z∈[-89,+58]mm · 3 of 24 slices shown (2 of 3)]
[im 1/24]
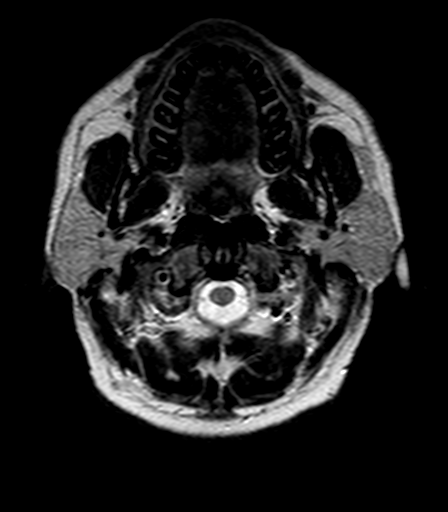
[im 12/24]
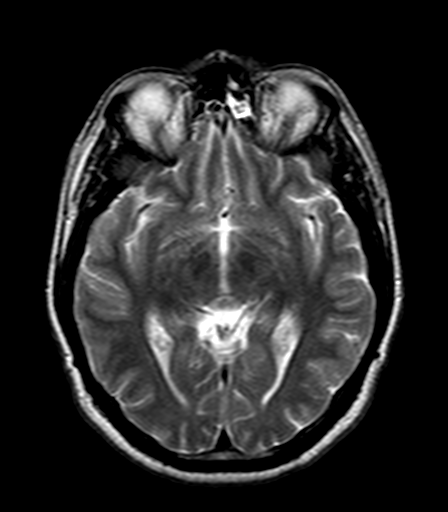
[im 24/24]
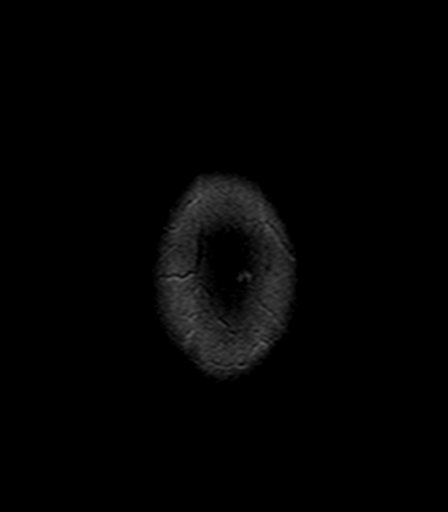

[Series 12: T1 · axial · 3.0mm · 0.45mm/px · z∈[-91,-50]mm · 3 of 52 slices shown (2 of 2)]
[im 1/52]
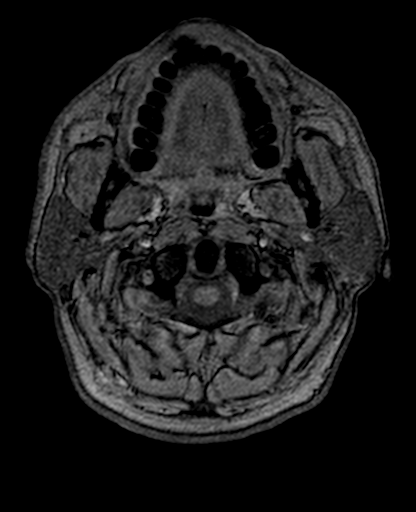
[im 8/52]
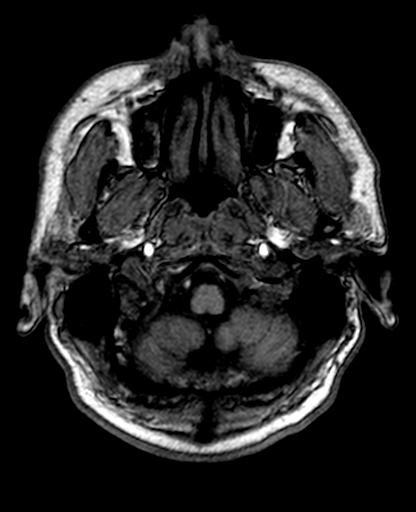
[im 15/52]
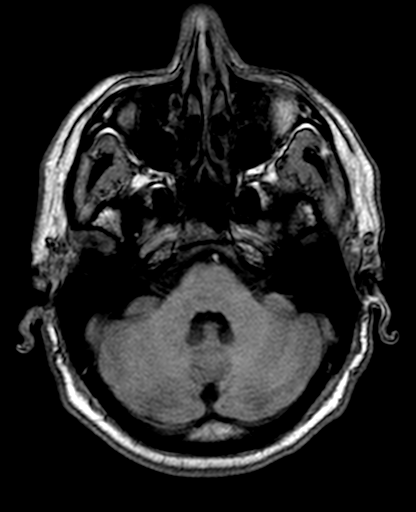

[Series 13: T2 · coronal · 5.0mm · 0.45mm/px · 4 of 29 slices shown (3 of 3)]
[im 1/29]
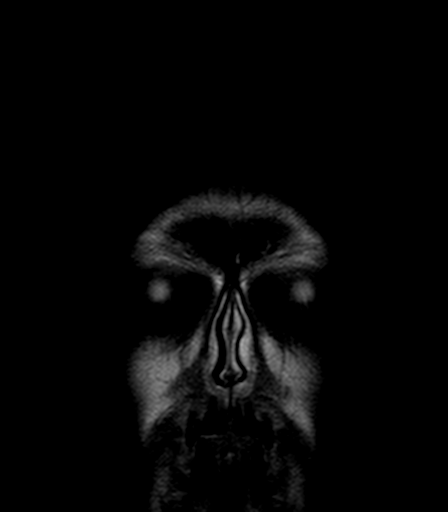
[im 10/29]
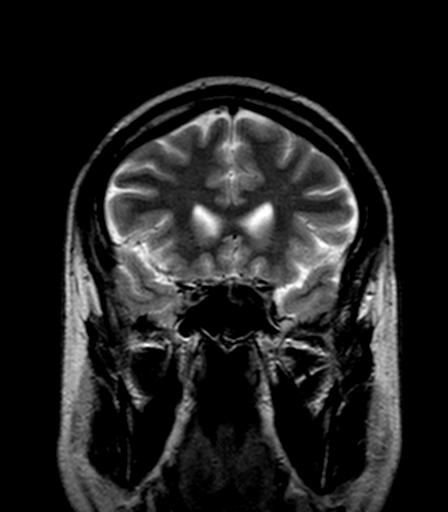
[im 19/29]
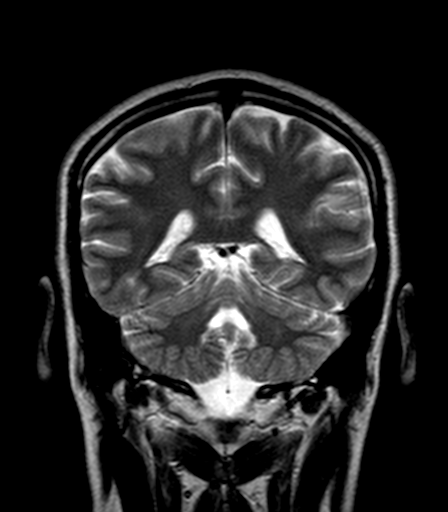
[im 29/29]
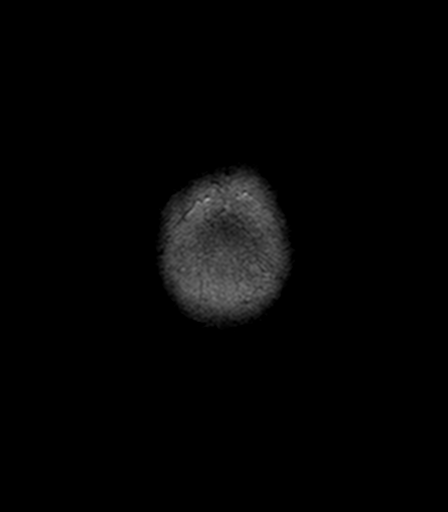

[32 of 48 positions shown; findings below may reference images not displayed]

FINDINGS: MR HEAD FINDINGS

Brain: Cerebral volume normal. No focal parenchymal signal
abnormality identified. No significant cerebral white matter disease
for age.

No abnormal foci of restricted diffusion to suggest acute or
subacute ischemia. Gray-white matter differentiation well
maintained. No encephalomalacia to suggest chronic infarction. No
foci of susceptibility artifact to suggest acute or chronic
intracranial hemorrhage.

No mass lesion, midline shift or mass effect. Ventricles normal size
without evidence for hydrocephalus. No extra-axial fluid collection.
Major dural sinuses are grossly patent.

Pituitary gland suprasellar region within normal limits. Midline
structures intact and normal.

Vascular: Major intracranial vascular flow voids are maintained.

Skull and upper cervical spine: Craniocervical junction normal.
Visualized upper cervical spine unremarkable. Bone marrow signal
intensity within normal limits. No scalp soft tissue abnormality.

Sinuses/Orbits: Globes and orbital soft tissues within normal
limits. Scattered mucosal thickening throughout the paranasal
sinuses, greatest within the ethmoidal air cells. No air-fluid level
to suggest active sinus infection. No mastoid effusion. Inner ear
structures grossly normal.

Other: None.

MRA NECK FINDINGS

Visualized portions of the aortic arch are of normal caliber with
normal 3 vessel morphology. No significant stenosis about the origin
of the great vessels. Visualized subclavian arteries widely patent.

Right common and internal carotid artery use widely patent to the
skullbase without flow-limiting stenosis. No significant
atheromatous narrowing about the right carotid bifurcation.

Left common and internal carotid artery is widely patent to the
skullbase without flow-limiting stenosis. No significant
atheromatous narrowing about the left carotid bifurcation.

Both of the vertebral arteries resting subclavian arteries. Right
vertebral artery slightly dominant. Vertebral arteries patent within
the neck with antegrade flow without flow-limiting stenosis or
occlusion.
IMPRESSION: 1. Normal MRI of the brain. No acute intracranial infarct or other
process identified.
2. Normal MRA of the neck.

## 2018-06-18 NOTE — Patient Instructions (Addendum)
Rodney Huff "Dillard",   Thank you for coming in to clinic today.  1. Continue toward weight maintenance with a healthy diet.  2.Cholesterol: - Your LDL is one point higher than normal.  LDL is your bad cholesterol that can build up in your arteries and veins and lead to heart attack and stroke over time.  This can be managed with medication and diet changes.   - Your HDL is a little low.  HDL is your good cholesterol.  When it is higher than 50, it can be protective against and prevent cholesterol buildup in your arteries that could lead to heart attack or stroke.   - You can lower your LDL and raise your HDL by exercising, or by increasing your dietary omega-3 fatty acids.  Omega-3s are found in olive oil, fish, and seeds and nuts like almonds, walnuts, and pecans.  Avoid saturated fats found in meat and dairy products.  Polyunsaturated fats and monounsaturated fats are better choices, but still limit your overall intake of these.  Begin eating more whole grain carbohydrates with more dietary fiber instead of "white" carbohydrates like potatoes, rice, and white bread.  Please schedule a follow-up appointment with Wilhelmina Mcardle, AGNP. Return in about 1 year (around 06/19/2019) for annual physical.  If you have any other questions or concerns, please feel free to call the clinic or send a message through MyChart. You may also schedule an earlier appointment if necessary.  You will receive a survey after today's visit either digitally by e-mail or paper by [employer]. Your experiences and feedback matter to [place].  Please respond so we know how we are doing as we provide care for you.   Wilhelmina Mcardle, DNP, AGNP-BC Adult Gerontology Nurse Practitioner [facility], [facility]

## 2018-06-18 NOTE — Progress Notes (Signed)
Subjective:    Patient ID: Rodney Huff, male    DOB: 02-02-1971, 47 y.o.   MRN: [id number]  Rodney Huff is a 47 y.o. male presenting on 06/18/2018 for Annual Exam   HPI Annual Physical Exam Patient has been feeling well.  They have no acute concerns today. Sleeps 6-7 hours per night interrupted x 1 at 2 am.  Easily able to get back to sleep "lately."  HEALTH MAINTENANCE: Weight/BMI: stable Physical activity: not regular Diet: regular, healthy diet Seatbelt: always Sunscreen: rarely PSA: normal Colon Cancer Screen: defer to age 89, pos fam history with father at age 63 HIV: neg Optometry: not regularly Dentistry: regular  VACCINES: Tetanus: 2-3 years ago after  Influenza: declines  Past Medical History:  Diagnosis Date  . Hyperlipidemia   . Missing teeth, acquired   . Stroke [facility]) 2017   Past Surgical History:  Procedure Laterality Date  . NO PAST SURGERIES     Social History   Socioeconomic History  . Marital status: Married    Spouse name: Vittorio Mohs  . Number of children: Not on file  . Years of education: Not on file  . Highest education level: 9th grade  Occupational History  . Not on file  Social Needs  . Financial resource strain: Not on file  . Food insecurity:    Worry: Not on file    Inability: Not on file  . Transportation needs:    Medical: Not on file    Non-medical: Not on file  Tobacco Use  . Smoking status: Never Smoker  . Smokeless tobacco: Never Used  Substance and Sexual Activity  . Alcohol use: Never    Frequency: Never  . Drug use: Never  . Sexual activity: Not on file  Lifestyle  . Physical activity:    Days per week: Not on file    Minutes per session: Not on file  . Stress: Not on file  Relationships  . Social connections:    Talks on phone: Not on file    Gets together: Not on file    Attends religious service: Not on file    Active member of club or organization: Not on file    Attends meetings of clubs or  organizations: Not on file    Relationship status: Not on file  . Intimate partner violence:    Fear of current or ex partner: Not on file    Emotionally abused: Not on file    Physically abused: Not on file    Forced sexual activity: Not on file  Other Topics Concern  . Not on file  Social History Narrative  . Not on file   Family History  Problem Relation Age of Onset  . Lung cancer Father        primary  . Colon cancer Father        primary  . Brain cancer Father        metastasis  . Throat cancer Father        metastasis  . Diabetes Mother   . Thyroid disease Mother   . Healthy Sister   . Healthy Sister   . CVA Maternal Grandmother   . CVA Paternal Grandmother   . CVA Paternal Grandfather    Current Outpatient Medications on File Prior to Visit  Medication Sig  . aspirin EC 81 MG EC tablet Take 1 tablet (81 mg total) by mouth daily.   No current facility-administered medications on file prior to visit.  Review of Systems  Constitutional: Negative for activity change, appetite change, fatigue and unexpected weight change.  HENT: Negative for congestion, hearing loss and trouble swallowing.   Eyes: Negative for visual disturbance.  Respiratory: Negative for choking, shortness of breath and wheezing.   Cardiovascular: Negative for chest pain and palpitations.  Gastrointestinal: Negative for abdominal pain, blood in stool, constipation and diarrhea.  Genitourinary: Negative for difficulty urinating, discharge, flank pain, genital sores, penile pain, penile swelling, scrotal swelling and testicular pain.  Musculoskeletal: Negative for arthralgias, back pain and myalgias.  Skin: Negative for color change, rash and wound.  Allergic/Immunologic: Negative for environmental allergies.  Neurological: Negative for dizziness, seizures, weakness and headaches.  Psychiatric/Behavioral: Negative for behavioral problems, decreased concentration, dysphoric mood, sleep  disturbance and suicidal ideas. The patient is not nervous/anxious.    Per HPI unless specifically indicated above     Objective:    BP 115/84 (BP Location: Right Arm, Patient Position: Sitting, Cuff Size: Normal)   Pulse 85   Temp 98.2 F (36.8 C) (Oral)   Ht 6\' 1"  (1.854 m)   Wt 211 lb 12.8 oz (96.1 kg)   BMI 27.94 kg/m   Wt Readings from Last 3 Encounters:  06/18/18 211 lb 12.8 oz (96.1 kg)  06/05/18 212 lb 9.6 oz (96.4 kg)  01/18/17 218 lb (98.9 kg)    Physical Exam  Constitutional: He is oriented to person, place, and time. He appears well-developed and well-nourished. No distress.  HENT:  Head: Normocephalic and atraumatic.  Right Ear: External ear normal.  Left Ear: External ear normal.  Nose: Nose normal.  Mouth/Throat: Oropharynx is clear and moist.  Eyes: Pupils are equal, round, and reactive to light. Conjunctivae are normal.  Neck: Normal range of motion. Neck supple. No JVD present. No tracheal deviation present. No thyromegaly present.  Cardiovascular: Normal rate, regular rhythm, normal heart sounds and intact distal pulses. Exam reveals no gallop and no friction rub.  No murmur heard. Pulmonary/Chest: Effort normal and breath sounds normal. No respiratory distress.  Abdominal: Soft. Bowel sounds are normal. He exhibits no distension and no mass. There is no hepatosplenomegaly. There is no tenderness. There is no rebound and no guarding. No hernia.  Musculoskeletal: Normal range of motion.  Lymphadenopathy:    He has no cervical adenopathy.  Neurological: He is alert and oriented to person, place, and time. No cranial nerve deficit.  Skin: Skin is warm and dry. Capillary refill takes less than 2 seconds.  Psychiatric: He has a normal mood and affect. His behavior is normal. Judgment and thought content normal.  Nursing note and vitals reviewed.  Results for orders placed or performed in visit on 06/13/18  Hemoglobin A1c  Result Value Ref Range   Hgb A1c MFr  Bld 5.4 <5.7 % of total Hgb   Mean Plasma Glucose 108 (calc)   eAG (mmol/L) 6.0 (calc)  CBC with Differential/Platelet  Result Value Ref Range   WBC 6.1 3.8 - 10.8 Thousand/uL   RBC 4.76 4.20 - 5.80 Million/uL   Hemoglobin 14.1 13.2 - 17.1 g/dL   HCT 16.1 09.6 - 04.5 %   MCV 85.3 80.0 - 100.0 fL   MCH 29.6 27.0 - 33.0 pg   MCHC 34.7 32.0 - 36.0 g/dL   RDW 40.9 81.1 - 91.4 %   Platelets 286 140 - 400 Thousand/uL   MPV 10.5 7.5 - 12.5 fL   Neutro Abs 3,703 1,500 - 7,800 cells/uL   Lymphs Abs 1,800 850 - 3,900  cells/uL   WBC mixed population 372 200 - 950 cells/uL   Eosinophils Absolute 183 15 - 500 cells/uL   Basophils Absolute 43 0 - 200 cells/uL   Neutrophils Relative % 60.7 %   Total Lymphocyte 29.5 %   Monocytes Relative 6.1 %   Eosinophils Relative 3.0 %   Basophils Relative 0.7 %  COMPLETE METABOLIC PANEL WITH GFR  Result Value Ref Range   Glucose, Bld 95 65 - 99 mg/dL   BUN 14 7 - 25 mg/dL   Creat 5.40 9.81 - 1.91 mg/dL   GFR, Est Non African American 82 > OR = 60 mL/min/1.21m2   GFR, Est African American 96 > OR = 60 mL/min/1.71m2   BUN/Creatinine Ratio NOT APPLICABLE 6 - 22 (calc)   Sodium 139 135 - 146 mmol/L   Potassium 5.2 3.5 - 5.3 mmol/L   Chloride 106 98 - 110 mmol/L   CO2 27 20 - 32 mmol/L   Calcium 9.6 8.6 - 10.3 mg/dL   Total Protein 7.2 6.1 - 8.1 g/dL   Albumin 4.6 3.6 - 5.1 g/dL   Globulin 2.6 1.9 - 3.7 g/dL (calc)   AG Ratio 1.8 1.0 - 2.5 (calc)   Total Bilirubin 0.4 0.2 - 1.2 mg/dL   Alkaline phosphatase (APISO) 61 40 - 115 U/L   AST 18 10 - 40 U/L   ALT 23 9 - 46 U/L  Lipid panel  Result Value Ref Range   Cholesterol 149 <200 mg/dL   HDL 33 (L) >47 mg/dL   Triglycerides 70 <829 mg/dL   LDL Cholesterol (Calc) 100 (H) mg/dL (calc)   Total CHOL/HDL Ratio 4.5 <5.0 (calc)   Non-HDL Cholesterol (Calc) 116 <130 mg/dL (calc)  PSA  Result Value Ref Range   PSA 1.1 < OR = 4.0 ng/mL  TSH  Result Value Ref Range   TSH 1.31 0.40 - 4.50 mIU/L        Assessment & Plan:   Problem List Items Addressed This Visit    None    Visit Diagnoses    Encounter for annual physical exam    -  Primary    Annual physical exam with no new findings.  Well adult with no acute concerns. Reviewed pre-visit labs with mild dyslipidemia.    Plan: 1. Obtain health maintenance screenings as above according to age. - Increase physical activity to 30 minutes most days of the week.  - Eat healthy diet high in vegetables and fruits; low in refined carbohydrates. - Reviewed cholesterol and lifestyle modification to improve values.  Recent weight loss may be impacting lower HDL. 2. Return 1 year for annual physical.     Follow up plan: Return in about 1 year (around 06/19/2019) for annual physical.  Wilhelmina Mcardle, DNP, AGPCNP-BC Adult Gerontology Primary Care Nurse Practitioner [facility] 06/18/2018, 8:48 AM

## 2022-01-30 ENCOUNTER — Ambulatory Visit: Payer: BC Managed Care – PPO | Admitting: Urology

## 2022-01-30 ENCOUNTER — Encounter: Payer: Self-pay | Admitting: Urology

## 2022-01-30 VITALS — BP 146/92 | HR 103 | Ht 73.0 in | Wt 239.0 lb

## 2022-01-30 DIAGNOSIS — R3129 Other microscopic hematuria: Secondary | ICD-10-CM

## 2022-01-30 DIAGNOSIS — R300 Dysuria: Secondary | ICD-10-CM

## 2022-01-30 LAB — URINALYSIS, COMPLETE
Bilirubin, UA: NEGATIVE
Glucose, UA: NEGATIVE
Ketones, UA: NEGATIVE
Leukocytes,UA: NEGATIVE
Nitrite, UA: NEGATIVE
Specific Gravity, UA: 1.03 (ref 1.005–1.030)
Urobilinogen, Ur: 0.2 mg/dL (ref 0.2–1.0)
pH, UA: 5.5 (ref 5.0–7.5)

## 2022-01-30 LAB — MICROSCOPIC EXAMINATION

## 2022-01-30 MED ORDER — TAMSULOSIN HCL 0.4 MG PO CAPS: 0.4000 mg | ORAL_CAPSULE | Freq: Every day | ORAL | 0 refills | Status: DC

## 2022-01-30 MED ORDER — MELOXICAM 7.5 MG PO TABS: 7.5000 mg | ORAL_TABLET | Freq: Every day | ORAL | 1 refills | Status: DC

## 2022-01-30 NOTE — Progress Notes (Signed)
01/30/22 1:00 PM   Rodney Huff 02-Jul-1971 [id number]  Referring provider:  Mick Sell, MD [address] [place],  [place] [address] Chief Complaint  Patient presents with   Dysuria    New Patient      HPI: Rodney Huff is a 51 y.o.male who presents today for further evaluation of dysuria.   He was seen by his PCP, Dr Sampson Goon, on 12/21/2021 for painful urination. UA was nitrite positive with 100 glucose and proteins, and rare bacteria.  He was treated for presumed possible UTI at this time with 5 days of antibiotics.  His symptoms fail to improve or resolve and he was given additional 10 days of antibiotic in light of his upcoming travel to [place] as a precaution.  He is accompanied by his wife and reports today that after he ejaculated after sexual intercourse is when his symptoms started.  This was about a month ago.  His pain has been radiating to his left testicle as well as left lower quadrant.  He reports at times he feels like his left testicle swells up but then goes back to normal size.  He is also having some dysuria.  He feels discomfort underneath his scrotum and in his suprapubic area.  Overall, he describes this is fairly constant.  His UA has 3-10 RBCs per higher prior field today.  He denies a history of kidney stones or smoking.    He is accompanied by his wife and reports today that after he ejaculated after sexual intercourse is when his symptoms started.  He continues to have exacerbations of his pain with ejaculation.  He denies any penile discharge.  Most recent PSA 0.71 on 12/21/2021.  PMH: Past Medical History:  Diagnosis Date   Complicated migraine 08/03/2017   Hyperlipidemia    Missing teeth, acquired    Mixed dyslipidemia 06/05/2018   Overweight with body mass index (BMI) 25.0-29.9 06/05/2018   Stroke (HCC) 2017   TIA (transient ischemic attack) 01/09/2017    Surgical History: Past Surgical History:  Procedure Laterality Date    NO PAST SURGERIES      Home Medications:  Allergies as of 01/30/2022   No Known Allergies      Medication List        Accurate as of January 30, 2022  1:00 PM. If you have any questions, ask your nurse or doctor.          aspirin EC 81 MG tablet Take 1 tablet (81 mg total) by mouth daily.   buPROPion 150 MG 24 hr tablet Commonly known as: WELLBUTRIN XL Take 150 mg by mouth daily.   meloxicam 7.5 MG tablet Commonly known as: Mobic Take 1 tablet (7.5 mg total) by mouth daily. Started by: Vanna Scotland, MD   phentermine 37.5 MG tablet Commonly known as: ADIPEX-P Take 37.5 mg by mouth daily.   tamsulosin 0.4 MG Caps capsule Commonly known as: FLOMAX Take 1 capsule (0.4 mg total) by mouth daily. Started by: Vanna Scotland, MD   Tylenol 8 Hour 650 MG CR tablet Generic drug: acetaminophen        Allergies:  No Known Allergies  Family History: Family History  Problem Relation Age of Onset   Diabetes Mother    Thyroid disease Mother    Lung cancer Father        primary   Colon cancer Father 21       primary   Brain cancer Father  metastasis   Throat cancer Father        metastasis   Healthy Sister    Healthy Sister    CVA Maternal Grandmother    CVA Paternal Grandmother    CVA Paternal Grandfather    Prostate cancer Neg Hx    Bladder Cancer Neg Hx    Kidney cancer Neg Hx     Social History:  reports that he has never smoked. He has never used smokeless tobacco. He reports that he does not drink alcohol and does not use drugs.   Physical Exam: BP (!) 146/92   Pulse (!) 103   Ht 6\' 1"  (1.854 m)   Wt 239 lb (108.4 kg)   BMI 31.53 kg/m   Constitutional:  Alert and oriented, No acute distress.  Accompanied by his wife today HEENT: [place] AT, moist mucus membranes.  Trachea midline, no masses. Cardiovascular: No clubbing, cyanosis, or edema. Respiratory: Normal respiratory effort, no increased work of breathing. GU:  No CVA tenderness.  No  bladder fullness or masses.  Patient with uncircumcised phallus. Foreskin easily retracted  Urethral meatus is patent.  No penile discharge. No penile lesions or rashes. Scrotum without lesions, cysts, rashes and/or edema.  No masses are appreciated in the testicles. 1 cm left epiddymal cyst that was nontender. Rectal: Rectal exam deferred as unlikely to change management Skin: No rashes, bruises or suspicious lesions. Neurologic: Grossly intact, no focal deficits, moving all 4 extremities. Psychiatric: Normal mood and affect.  Laboratory Data: Lab Results  Component Value Date   CREATININE 1.08 06/13/2018   Lab Results  Component Value Date   HGBA1C 5.4 06/13/2018   Urinalysis:  3-10 RBCs   Assessment & Plan:   Microscopic hematuria - We discussed the differential diagnosis for microscopic hematuria including nephrolithiasis, renal or upper tract tumors, bladder stones, UTIs, or bladder tumors and prostatitis as well as undetermined etiologies. Per AUA guidelines, I did recommend RUS to further evaluate and rule out underlying issues, urine cytology, and office cystoscopy (low risk based on risk factors, age, and degree of hematuria)  2. Dysuria /probable inflammatory prostatitis - Urine sent for GC/chlamydia prop amb - Will treat him with Flomax and meloxicam along with supportive care - Flomax and Meloxicam; prescribed  -Unlikely infectious process -Differential diagnosis includes kidney stone underlying malignancy although less likely or occult infection  Return for cystoscopy /follow-up renal ultrasound  13/08/2017 Shyana Kulakowski,acting as a scribe for Karsten Ro, MD.,have documented all relevant documentation on the behalf of Vanna Scotland, MD,as directed by  Vanna Scotland, MD while in the presence of Vanna Scotland, MD.  I have reviewed the above documentation for accuracy and completeness, and I agree with the above.   Vanna Scotland, MD  [facility] [address] [place], [place] [phone]

## 2022-01-30 NOTE — Patient Instructions (Signed)

## 2022-01-31 ENCOUNTER — Inpatient Hospital Stay: Payer: BC Managed Care – PPO

## 2022-01-31 ENCOUNTER — Inpatient Hospital Stay: Payer: BC Managed Care – PPO | Attending: Oncology | Admitting: Oncology

## 2022-01-31 ENCOUNTER — Other Ambulatory Visit: Payer: Self-pay

## 2022-01-31 ENCOUNTER — Encounter: Payer: Self-pay | Admitting: Oncology

## 2022-01-31 VITALS — BP 144/95 | HR 113 | Temp 97.9°F | Resp 18 | Ht 73.0 in | Wt 241.0 lb

## 2022-01-31 DIAGNOSIS — Z79899 Other long term (current) drug therapy: Secondary | ICD-10-CM | POA: Insufficient documentation

## 2022-01-31 DIAGNOSIS — R319 Hematuria, unspecified: Secondary | ICD-10-CM | POA: Insufficient documentation

## 2022-01-31 DIAGNOSIS — Z801 Family history of malignant neoplasm of trachea, bronchus and lung: Secondary | ICD-10-CM | POA: Diagnosis not present

## 2022-01-31 DIAGNOSIS — R5383 Other fatigue: Secondary | ICD-10-CM | POA: Diagnosis not present

## 2022-01-31 DIAGNOSIS — G4730 Sleep apnea, unspecified: Secondary | ICD-10-CM | POA: Insufficient documentation

## 2022-01-31 DIAGNOSIS — Z800 Family history of malignant neoplasm of digestive organs: Secondary | ICD-10-CM | POA: Diagnosis not present

## 2022-01-31 DIAGNOSIS — D649 Anemia, unspecified: Secondary | ICD-10-CM | POA: Insufficient documentation

## 2022-01-31 DIAGNOSIS — F32A Depression, unspecified: Secondary | ICD-10-CM | POA: Insufficient documentation

## 2022-01-31 LAB — CBC WITH DIFFERENTIAL/PLATELET
Abs Immature Granulocytes: 0.01 10*3/uL (ref 0.00–0.07)
Basophils Absolute: 0 10*3/uL (ref 0.0–0.1)
Basophils Relative: 1 %
Eosinophils Absolute: 0.3 10*3/uL (ref 0.0–0.5)
Eosinophils Relative: 4 %
HCT: 41.8 % (ref 39.0–52.0)
Hemoglobin: 14.3 g/dL (ref 13.0–17.0)
Immature Granulocytes: 0 %
Lymphocytes Relative: 28 %
Lymphs Abs: 1.8 10*3/uL (ref 0.7–4.0)
MCH: 29.4 pg (ref 26.0–34.0)
MCHC: 34.2 g/dL (ref 30.0–36.0)
MCV: 85.8 fL (ref 80.0–100.0)
Monocytes Absolute: 0.4 10*3/uL (ref 0.1–1.0)
Monocytes Relative: 7 %
Neutro Abs: 3.7 10*3/uL (ref 1.7–7.7)
Neutrophils Relative %: 60 %
Platelets: 281 10*3/uL (ref 150–400)
RBC: 4.87 MIL/uL (ref 4.22–5.81)
RDW: 12.5 % (ref 11.5–15.5)
WBC: 6.2 10*3/uL (ref 4.0–10.5)
nRBC: 0 % (ref 0.0–0.2)

## 2022-01-31 LAB — TECHNOLOGIST SMEAR REVIEW: Plt Morphology: ADEQUATE

## 2022-01-31 LAB — RETIC PANEL
Immature Retic Fract: 6.6 % (ref 2.3–15.9)
RBC.: 4.83 MIL/uL (ref 4.22–5.81)
Retic Count, Absolute: 89.4 10*3/uL (ref 19.0–186.0)
Retic Ct Pct: 1.9 % (ref 0.4–3.1)
Reticulocyte Hemoglobin: 34 pg (ref >27.9)

## 2022-01-31 LAB — CYTOLOGY - NON PAP

## 2022-01-31 LAB — FOLATE: Folate: 10.6 ng/mL (ref >5.9)

## 2022-01-31 LAB — LACTATE DEHYDROGENASE: LDH: 154 U/L (ref 98–192)

## 2022-01-31 NOTE — Progress Notes (Signed)
Pt here to establish care for anemia.  °

## 2022-01-31 NOTE — Assessment & Plan Note (Addendum)
Anemia: Will check CBC w differential, Folate, reticulocytes, blood smear, LDH, haptoglobin,testosteron level,  monoclonal gammopathy evaluation.

## 2022-01-31 NOTE — Progress Notes (Signed)
Hematology/Oncology Consult note [phone] [phone]      Patient Care Team: Leonel Ramsay, MD as PCP - General (Infectious Diseases)   REFERRING PROVIDER: Leonel Ramsay, MD  CHIEF COMPLAINTS/REASON FOR VISIT:  Anemia  ASSESSMENT & PLAN:  Normocytic anemia Anemia: Will check CBC w differential, Folate, reticulocytes, blood smear, LDH, haptoglobin,testosteron level,  monoclonal gammopathy evaluation.     Orders Placed This Encounter  Procedures   Testosterone    Standing Status:   Future    Number of Occurrences:   1    Standing Expiration Date:   02/01/2023   Folate    Standing Status:   Future    Number of Occurrences:   1    Standing Expiration Date:   02/01/2023   Technologist smear review    Standing Status:   Future    Number of Occurrences:   1    Standing Expiration Date:   02/01/2023   CBC with Differential/Platelet    Standing Status:   Future    Number of Occurrences:   1    Standing Expiration Date:   02/01/2023   Multiple Myeloma Panel (SPEP&IFE w/QIG)    Standing Status:   Future    Number of Occurrences:   1    Standing Expiration Date:   02/01/2023   Kappa/lambda light chains    Standing Status:   Future    Number of Occurrences:   1    Standing Expiration Date:   02/01/2023   Retic Panel    Standing Status:   Future    Number of Occurrences:   1    Standing Expiration Date:   02/01/2023   Lactate dehydrogenase    Standing Status:   Future    Number of Occurrences:   1    Standing Expiration Date:   02/01/2023   Follow up in 3 weeks to review results.  All questions were answered. The patient knows to call the clinic with any problems, questions or concerns.  Earlie Server, MD, PhD [facility] Hematology Oncology 01/31/2022     HISTORY OF PRESENTING ILLNESS:  Rodney Huff is a  51 y.o.  male with PMH listed below who was referred to me for anemia Reviewed patient's recent labs that was done.  He was found to  have slightly low hemoglobin since 08/22/21, hemoglobin 13.6, mcv 87.6. previously his hemoglobin was 14.7 Most recent cbc on 01/01/22 showed a hemoglobin of 13.9. iron 95, ferritin 107, tibc 317, iron saturation 30.  + fatigue. + depression on antidepressants. Decreased sexual performance.  He denies recent chest pain on exertion, shortness of breath on minimal exertion, pre-syncopal episodes, or palpitations He had not noticed any recent bleeding such as epistaxis, hematuria or hematochezia.  He takes Mobic 7.45m daily recently. He is on antiplatelets agents. His last colonoscopy was  He denies any pica and eats a variety of diet. He has sleep apnea and has been on CPAP for couple of months.  + hematuria, he was established care with Dr.Brandon and there is plan of cystoscopy   MEDICAL HISTORY:  Past Medical History:  Diagnosis Date   Complicated migraine 101/77/9390  Hyperlipidemia    Missing teeth, acquired    Mixed dyslipidemia 06/05/2018   Overweight with body mass index (BMI) 25.0-29.9 06/05/2018   Stroke ([facility] 2017   TIA (transient ischemic attack) 01/09/2017    SURGICAL HISTORY: Past Surgical History:  Procedure Laterality Date   NO PAST SURGERIES  SOCIAL HISTORY: Social History   Socioeconomic History   Marital status: Married    Spouse name: Christina Carr   Number of children: 2   Years of education: Not on file   Highest education level: 9th grade  Occupational History   Not on file  Tobacco Use   Smoking status: Never   Smokeless tobacco: Never  Vaping Use   Vaping Use: Never used  Substance and Sexual Activity   Alcohol use: Never   Drug use: Never   Sexual activity: Not on file  Other Topics Concern   Not on file  Social History Narrative   Not on file   Social Determinants of Health   Financial Resource Strain: Low Risk  (06/18/2018)   Overall Financial Resource Strain (CARDIA)    Difficulty of Paying Living Expenses: Not hard at all   Food Insecurity: No Food Insecurity (06/18/2018)   Hunger Vital Sign    Worried About Running Out of Food in the Last Year: Never true    Ran Out of Food in the Last Year: Never true  Transportation Needs: No Transportation Needs (06/18/2018)   PRAPARE - Transportation    Lack of Transportation (Medical): No    Lack of Transportation (Non-Medical): No  Physical Activity: Not on file  Stress: Not on file  Social Connections: Unknown (06/18/2018)   Social Connection and Isolation Panel [NHANES]    Frequency of Communication with Friends and Family: Not on file    Frequency of Social Gatherings with Friends and Family: Not on file    Attends Religious Services: Not on file    Active Member of Clubs or Organizations: Not on file    Attends Club or Organization Meetings: Not on file    Marital Status: Married  Intimate Partner Violence: Not At Risk (06/18/2018)   Humiliation, Afraid, Rape, and Kick questionnaire    Fear of Current or Ex-Partner: No    Emotionally Abused: No    Physically Abused: No    Sexually Abused: No    FAMILY HISTORY: Family History  Problem Relation Age of Onset   Diabetes Mother    Thyroid disease Mother    High Cholesterol Mother    Lung cancer Father        primary   Colon cancer Father 68       primary   Brain cancer Father        metastasis   Throat cancer Father        metastasis   Healthy Sister    Healthy Sister    Cancer - Prostate Paternal Uncle    Throat cancer Paternal Uncle    Lymphoma Paternal Uncle    CVA Maternal Grandmother    CVA Paternal Grandmother    CVA Paternal Grandfather    Prostate cancer Neg Hx    Bladder Cancer Neg Hx    Kidney cancer Neg Hx     ALLERGIES:  has No Known Allergies.  MEDICATIONS:  Current Outpatient Medications  Medication Sig Dispense Refill   acetaminophen (TYLENOL 8 HOUR) 650 MG CR tablet      aspirin EC 81 MG EC tablet Take 1 tablet (81 mg total) by mouth daily. 30 tablet 0   buPROPion  (WELLBUTRIN XL) 150 MG 24 hr tablet Take 150 mg by mouth daily.     meloxicam (MOBIC) 7.5 MG tablet Take 1 tablet (7.5 mg total) by mouth daily. 10 tablet 1   phentermine (ADIPEX-P) 37.5 MG tablet Take 37.5 mg by   mouth daily.     tamsulosin (FLOMAX) 0.4 MG CAPS capsule Take 1 capsule (0.4 mg total) by mouth daily. 30 capsule 0   No current facility-administered medications for this visit.    Review of Systems  Constitutional:  Positive for fatigue. Negative for appetite change, chills, fever and unexpected weight change.  HENT:   Negative for hearing loss and voice change.   Eyes:  Negative for eye problems and icterus.  Respiratory:  Negative for chest tightness, cough and shortness of breath.   Cardiovascular:  Negative for chest pain and leg swelling.  Gastrointestinal:  Negative for abdominal distention and abdominal pain.  Endocrine: Negative for hot flashes.  Genitourinary:  Negative for difficulty urinating, dysuria and frequency.   Musculoskeletal:  Negative for arthralgias.  Skin:  Negative for itching and rash.  Neurological:  Negative for light-headedness and numbness.  Hematological:  Negative for adenopathy. Does not bruise/bleed easily.  Psychiatric/Behavioral:  Negative for confusion.     PHYSICAL EXAMINATION: ECOG PERFORMANCE STATUS: 0 - Asymptomatic Vitals:   01/31/22 1106  BP: (!) 144/95  Pulse: (!) 113  Resp: 18  Temp: 97.9 F (36.6 C)   Filed Weights   01/31/22 1106  Weight: 241 lb (109.3 kg)    Physical Exam Constitutional:      General: He is not in acute distress. HENT:     Head: Normocephalic and atraumatic.  Eyes:     General: No scleral icterus. Cardiovascular:     Rate and Rhythm: Normal rate and regular rhythm.     Heart sounds: Normal heart sounds.  Pulmonary:     Effort: Pulmonary effort is normal. No respiratory distress.     Breath sounds: No wheezing.  Abdominal:     General: Bowel sounds are normal. There is no distension.      Palpations: Abdomen is soft.  Musculoskeletal:        General: No deformity. Normal range of motion.     Cervical back: Normal range of motion and neck supple.  Skin:    General: Skin is warm and dry.     Findings: No erythema or rash.  Neurological:     Mental Status: He is alert and oriented to person, place, and time. Mental status is at baseline.     Cranial Nerves: No cranial nerve deficit.     Coordination: Coordination normal.  Psychiatric:        Mood and Affect: Mood normal.      LABORATORY DATA:  I have reviewed the data as listed Lab Results  Component Value Date   WBC 6.2 01/31/2022   HGB 14.3 01/31/2022   HCT 41.8 01/31/2022   MCV 85.8 01/31/2022   PLT 281 01/31/2022   Lab Results  Component Value Date   NA 139 06/13/2018   K 5.2 06/13/2018   CL 106 06/13/2018   CO2 27 06/13/2018   No results found for: "IRON", "TIBC", "FERRITIN", "IRONPCTSAT"   RADIOGRAPHIC STUDIES: I have personally reviewed the radiological images as listed and agreed with the findings in the report. No results found.

## 2022-02-01 LAB — TESTOSTERONE: Testosterone: 345 ng/dL (ref 264–916)

## 2022-02-01 LAB — KAPPA/LAMBDA LIGHT CHAINS
Kappa free light chain: 21 mg/L — ABNORMAL HIGH (ref 3.3–19.4)
Kappa, lambda light chain ratio: 1.52 (ref 0.26–1.65)
Lambda free light chains: 13.8 mg/L (ref 5.7–26.3)

## 2022-02-01 LAB — GC/CHLAMYDIA PROBE AMP
Chlamydia trachomatis, NAA: NEGATIVE
Neisseria Gonorrhoeae by PCR: NEGATIVE

## 2022-02-02 ENCOUNTER — Ambulatory Visit
Admission: RE | Admit: 2022-02-02 | Discharge: 2022-02-02 | Disposition: A | Discharge status: Home / Self Care | Payer: BC Managed Care – PPO | Source: Ambulatory Visit | Attending: Urology | Admitting: Urology

## 2022-02-02 DIAGNOSIS — R300 Dysuria: Secondary | ICD-10-CM | POA: Diagnosis not present

## 2022-02-02 DIAGNOSIS — R3129 Other microscopic hematuria: Secondary | ICD-10-CM | POA: Insufficient documentation

## 2022-02-05 LAB — MULTIPLE MYELOMA PANEL, SERUM
Albumin SerPl Elph-Mcnc: 3.9 g/dL (ref 2.9–4.4)
Albumin/Glob SerPl: 1.2 (ref 0.7–1.7)
Alpha 1: 0.2 g/dL (ref 0.0–0.4)
Alpha2 Glob SerPl Elph-Mcnc: 0.8 g/dL (ref 0.4–1.0)
B-Globulin SerPl Elph-Mcnc: 1 g/dL (ref 0.7–1.3)
Gamma Glob SerPl Elph-Mcnc: 1.4 g/dL (ref 0.4–1.8)
Globulin, Total: 3.4 g/dL (ref 2.2–3.9)
IgA: 232 mg/dL (ref 90–386)
IgG (Immunoglobin G), Serum: 1326 mg/dL (ref 603–1613)
IgM (Immunoglobulin M), Srm: 57 mg/dL (ref 20–172)
Total Protein ELP: 7.3 g/dL (ref 6.0–8.5)

## 2022-02-06 ENCOUNTER — Encounter: Payer: Self-pay | Admitting: Urology

## 2022-02-14 ENCOUNTER — Other Ambulatory Visit: Payer: BC Managed Care – PPO | Admitting: Urology

## 2022-02-20 ENCOUNTER — Encounter: Payer: Self-pay | Admitting: Urology

## 2022-02-20 ENCOUNTER — Ambulatory Visit: Payer: BC Managed Care – PPO | Admitting: Urology

## 2022-02-20 VITALS — BP 128/86 | HR 116 | Ht 73.0 in | Wt 241.0 lb

## 2022-02-20 DIAGNOSIS — R300 Dysuria: Secondary | ICD-10-CM

## 2022-02-20 DIAGNOSIS — R3129 Other microscopic hematuria: Secondary | ICD-10-CM

## 2022-02-20 DIAGNOSIS — N418 Other inflammatory diseases of prostate: Secondary | ICD-10-CM | POA: Diagnosis not present

## 2022-02-20 NOTE — Progress Notes (Signed)
   02/21/22 CC:  Chief Complaint  Patient presents with   Cysto      HPI: Rodney Huff is a 51 y.o. male with a personal history of microscopic hematuria, dysuria, and probable inflammatory prostatitis, who presents today fo a cystoscopy and RUS results.   He was seen by his PCP, Dr Sampson Goon, on 12/21/2021 for painful urination. UA was nitrite positive with 100 glucose and proteins, and rare bacteria.  He was treated for presumed possible UTI at this time with 5 days of antibiotics.  His symptoms fail to improve or resolve and he was given additional 10 days of antibiotic in light of his upcoming travel to [place] as a precaution.  Managed on Flomax and meloxicam.   RUS on 02/02/2022 visualized normal study.  No cause for microhematuria identified.  He reports that his symptoms worsen when he drives.   Vitals:   02/20/22 1456  BP: 128/86  Pulse: (!) 116  NED. A&Ox3.   No respiratory distress   Abd soft, NT, ND Normal phallus with bilateral descended testicles  Cystoscopy Procedure Note  Patient identification was confirmed, informed consent was obtained, and patient was prepped using Betadine solution.  Lidocaine jelly was administered per urethral meatus.     Pre-Procedure: - Inspection reveals a normal caliber ureteral meatus.  Procedure: The flexible cystoscope was introduced without difficulty - No urethral strictures/lesions are present. - Normal prostate  - Normal bladder neck - Bilateral ureteral orifices identified - Bladder mucosa  reveals no ulcers, tumors, or lesions - No bladder stones - No trabeculation  Retroflexion shows unremarkable    Post-Procedure: - Patient tolerated the procedure well   Assessment/ Plan:  Inflammatory prostatitis - Clinical history consistent with noninfectious prostatitis  - Recommend continued supportive care continue NSAIDs and use of donut when driving as symptoms are exacerbated when sitting  - If it fails to  completely resolve recommend consideration of physical therapy for pelvic floor intervention  - cystoscopy is  reassuring   F/u prn  Tawni Millers as a scribe for Vanna Scotland, MD.,have documented all relevant documentation on the behalf of Vanna Scotland, MD,as directed by  Vanna Scotland, MD while in the presence of Vanna Scotland, MD.

## 2022-02-28 ENCOUNTER — Ambulatory Visit: Payer: BC Managed Care – PPO | Admitting: Oncology

## 2022-03-14 ENCOUNTER — Inpatient Hospital Stay: Payer: BC Managed Care – PPO | Attending: Oncology | Admitting: Oncology

## 2022-03-14 ENCOUNTER — Encounter: Payer: Self-pay | Admitting: Oncology

## 2022-03-14 VITALS — BP 132/87 | HR 112 | Temp 98.2°F | Resp 18 | Wt 241.8 lb

## 2022-03-14 DIAGNOSIS — Z7982 Long term (current) use of aspirin: Secondary | ICD-10-CM | POA: Insufficient documentation

## 2022-03-14 DIAGNOSIS — R5383 Other fatigue: Secondary | ICD-10-CM | POA: Diagnosis not present

## 2022-03-14 DIAGNOSIS — Z79899 Other long term (current) drug therapy: Secondary | ICD-10-CM | POA: Diagnosis not present

## 2022-03-14 DIAGNOSIS — D649 Anemia, unspecified: Secondary | ICD-10-CM | POA: Diagnosis present

## 2022-03-14 DIAGNOSIS — F32A Depression, unspecified: Secondary | ICD-10-CM | POA: Insufficient documentation

## 2022-03-14 NOTE — Progress Notes (Signed)
Hematology/Oncology Progress note [phone] [phone]         Patient Care Team: Mick Sell, MD as PCP - General (Infectious Diseases)   REFERRING PROVIDER: Mick Sell, MD  CHIEF COMPLAINTS/REASON FOR VISIT:  Anemia  ASSESSMENT & PLAN:   Fatigue Labs are reviewed and discussed with patient. Patient's cbc showed no anemia, no iron deficiency. Normal LDH SPEP showed no M protein, and light chain ratio is normal.   Fatigue is likely due to depression. Recommend patient to further discuss with PCP.  No need to follow up   All questions were answered. The patient knows to call the clinic with any problems, questions or concerns.  Rickard Patience, MD, PhD [facility] Hematology Oncology 03/14/2022     HISTORY OF PRESENTING ILLNESS:  Rodney Huff is a  51 y.o.  male with PMH listed below who was referred to me for anemia Reviewed patient's recent labs that was done.  He was found to have slightly low hemoglobin since 08/22/21, hemoglobin 13.6, mcv 87.6. previously his hemoglobin was 14.7 Most recent cbc on 01/01/22 showed a hemoglobin of 13.9. iron 95, ferritin 107, tibc 317, iron saturation 30.  + fatigue. + depression on antidepressants. Decreased sexual performance.  He denies recent chest pain on exertion, shortness of breath on minimal exertion, pre-syncopal episodes, or palpitations He had not noticed any recent bleeding such as epistaxis, hematuria or hematochezia.  He takes Mobic 7.5mg  daily recently. He is on antiplatelets agents. His last colonoscopy was  He denies any pica and eats a variety of diet. He has sleep apnea and has been on CPAP for couple of months.  + hematuria, he was established care with Dr.Brandon and there is plan of cystoscopy   INTERVAL HISTORY Rodney Huff is a 51 y.o. male who has above history reviewed by me today presents for follow up to review results.  No new complaints. Chronic fatigue and  depression  MEDICAL HISTORY:  Past Medical History:  Diagnosis Date   Complicated migraine 08/03/2017   Hyperlipidemia    Missing teeth, acquired    Mixed dyslipidemia 06/05/2018   Overweight with body mass index (BMI) 25.0-29.9 06/05/2018   Stroke (HCC) 2017   TIA (transient ischemic attack) 01/09/2017    SURGICAL HISTORY: Past Surgical History:  Procedure Laterality Date   NO PAST SURGERIES      SOCIAL HISTORY: Social History   Socioeconomic History   Marital status: Married    Spouse name: Winford Hehn   Number of children: 2   Years of education: Not on file   Highest education level: 9th grade  Occupational History   Not on file  Tobacco Use   Smoking status: Never   Smokeless tobacco: Never  Vaping Use   Vaping Use: Never used  Substance and Sexual Activity   Alcohol use: Never   Drug use: Never   Sexual activity: Not on file  Other Topics Concern   Not on file  Social History Narrative   Not on file   Social Determinants of Health   Financial Resource Strain: Low Risk  (06/18/2018)   Overall Financial Resource Strain (CARDIA)    Difficulty of Paying Living Expenses: Not hard at all  Food Insecurity: No Food Insecurity (06/18/2018)   Hunger Vital Sign    Worried About Running Out of Food in the Last Year: Never true    Ran Out of Food in the Last Year: Never true  Transportation Needs: No Transportation Needs (06/18/2018)  PRAPARE - Administrator, Civil Service (Medical): No    Lack of Transportation (Non-Medical): No  Physical Activity: Not on file  Stress: Not on file  Social Connections: Unknown (06/18/2018)   Social Connection and Isolation Panel [NHANES]    Frequency of Communication with Friends and Family: Not on file    Frequency of Social Gatherings with Friends and Family: Not on file    Attends Religious Services: Not on file    Active Member of Clubs or Organizations: Not on file    Attends Banker Meetings:  Not on file    Marital Status: Married  Intimate Partner Violence: Not At Risk (06/18/2018)   Humiliation, Afraid, Rape, and Kick questionnaire    Fear of Current or Ex-Partner: No    Emotionally Abused: No    Physically Abused: No    Sexually Abused: No    FAMILY HISTORY: Family History  Problem Relation Age of Onset   Diabetes Mother    Thyroid disease Mother    High Cholesterol Mother    Lung cancer Father        primary   Colon cancer Father 62       primary   Brain cancer Father        metastasis   Throat cancer Father        metastasis   Healthy Sister    Healthy Sister    Cancer - Prostate Paternal Uncle    Throat cancer Paternal Uncle    Lymphoma Paternal Uncle    CVA Maternal Grandmother    CVA Paternal Grandmother    CVA Paternal Grandfather    Prostate cancer Neg Hx    Bladder Cancer Neg Hx    Kidney cancer Neg Hx     ALLERGIES:  has No Known Allergies.  MEDICATIONS:  Current Outpatient Medications  Medication Sig Dispense Refill   acetaminophen (TYLENOL 8 HOUR) 650 MG CR tablet      aspirin EC 81 MG EC tablet Take 1 tablet (81 mg total) by mouth daily. 30 tablet 0   buPROPion (WELLBUTRIN XL) 150 MG 24 hr tablet Take 150 mg by mouth daily.     phentermine (ADIPEX-P) 37.5 MG tablet Take 37.5 mg by mouth daily.     No current facility-administered medications for this visit.    Review of Systems  Constitutional:  Positive for fatigue. Negative for appetite change, chills, fever and unexpected weight change.  HENT:   Negative for hearing loss and voice change.   Eyes:  Negative for eye problems and icterus.  Respiratory:  Negative for chest tightness, cough and shortness of breath.   Cardiovascular:  Negative for chest pain and leg swelling.  Gastrointestinal:  Negative for abdominal distention and abdominal pain.  Endocrine: Negative for hot flashes.  Genitourinary:  Negative for difficulty urinating, dysuria and frequency.   Musculoskeletal:   Negative for arthralgias.  Skin:  Negative for itching and rash.  Neurological:  Negative for light-headedness and numbness.  Hematological:  Negative for adenopathy. Does not bruise/bleed easily.  Psychiatric/Behavioral:  Positive for depression. Negative for confusion.     PHYSICAL EXAMINATION: ECOG PERFORMANCE STATUS: 0 - Asymptomatic Vitals:   03/14/22 1344  BP: 132/87  Pulse: (!) 112  Resp: 18  Temp: 98.2 F (36.8 C)   Filed Weights   03/14/22 1344  Weight: 241 lb 12.8 oz (109.7 kg)    Physical Exam Constitutional:      General: He is not in acute  distress. HENT:     Head: Normocephalic and atraumatic.  Eyes:     General: No scleral icterus. Cardiovascular:     Rate and Rhythm: Normal rate.  Pulmonary:     Effort: Pulmonary effort is normal. No respiratory distress.     Breath sounds: No wheezing.  Abdominal:     General: There is no distension.     Palpations: Abdomen is soft.  Musculoskeletal:        General: No deformity. Normal range of motion.     Cervical back: Normal range of motion and neck supple.  Skin:    General: Skin is warm and dry.  Neurological:     Mental Status: He is alert and oriented to person, place, and time. Mental status is at baseline.     Cranial Nerves: No cranial nerve deficit.     Coordination: Coordination normal.  Psychiatric:        Mood and Affect: Mood normal.      LABORATORY DATA:  I have reviewed the data as listed    Latest Ref Rng & Units 01/31/2022   11:43 AM 06/13/2018    8:24 AM 01/09/2017    8:06 PM  CBC  WBC 4.0 - 10.5 K/uL 6.2  6.1  8.7   Hemoglobin 13.0 - 17.0 g/dL 83.6  62.9  47.6   Hematocrit 39.0 - 52.0 % 41.8  40.6  41.6   Platelets 150 - 400 K/uL 281  286  318       Latest Ref Rng & Units 06/13/2018    8:24 AM 01/09/2017    8:06 PM 08/17/2015   10:55 AM  CMP  Glucose 65 - 99 mg/dL 95  546  95   BUN 7 - 25 mg/dL 14  17  12    Creatinine 0.60 - 1.35 mg/dL  5.03  5.46   Sodium 135 - 146 mmol/L  139  139  141   Potassium 3.5 - 5.3 mmol/L 5.2  3.6  4.3   Chloride 98 - 110 mmol/L 106  106  106   CO2 20 - 32 mmol/L 27  26  28    Calcium 8.6 - 10.3 mg/dL 9.6  9.2  9.9   Total Protein 6.1 - 8.1 g/dL 7.2  7.9  8.2   Total Bilirubin 0.2 - 1.2 mg/dL 0.4  0.9  0.8   Alkaline Phos 38 - 126 U/L  68  63   AST 10 - 40 U/L 18  35  29   ALT 9 - 46 U/L 23  43  45      RADIOGRAPHIC STUDIES: I have personally reviewed the radiological images as listed and agreed with the findings in the report. 5.68 RENAL  Result Date: 02/02/2022 CLINICAL DATA:  Microhematuria EXAM: RENAL / URINARY TRACT ULTRASOUND COMPLETE COMPARISON:  None Available. FINDINGS: Right Kidney: Renal measurements: 12.4 x 6.6 x 6.8 cm = volume: 294 mL. Echogenicity within normal limits. No mass or hydronephrosis visualized. Left Kidney: Renal measurements: 11.9 x 7.4 x 6.4 cm = volume: 296 mL. Echogenicity within normal limits. No mass or hydronephrosis visualized. Bladder: Appears normal for degree of bladder distention. Other: None. IMPRESSION: Normal study.  No cause for microhematuria identified. Electronically Signed   By: [place] III M.D.   On: 02/02/2022 11:11

## 2022-03-14 NOTE — Progress Notes (Signed)
Patient here for follow up. No new concerns voiced.  °

## 2022-03-14 NOTE — Assessment & Plan Note (Signed)
Labs are reviewed and discussed with patient. Patient's cbc showed no anemia, no iron deficiency. Normal LDH SPEP showed no M protein, and light chain ratio is normal.   Fatigue is likely due to depression. Recommend patient to further discuss with PCP.
